# Patient Record
Sex: Male | Born: 1945 | Race: Black or African American | Hispanic: No | Marital: Married | Smoking: Former smoker
Health system: Southern US, Community
[De-identification: ages and names within clinical notes are randomized; demographics above are authoritative.]

## PROBLEM LIST (undated history)

## (undated) DIAGNOSIS — I10 Essential (primary) hypertension: Secondary | ICD-10-CM

## (undated) DIAGNOSIS — G709 Myoneural disorder, unspecified: Secondary | ICD-10-CM

## (undated) DIAGNOSIS — N400 Enlarged prostate without lower urinary tract symptoms: Secondary | ICD-10-CM

## (undated) DIAGNOSIS — E119 Type 2 diabetes mellitus without complications: Secondary | ICD-10-CM

## (undated) DIAGNOSIS — G4730 Sleep apnea, unspecified: Secondary | ICD-10-CM

## (undated) DIAGNOSIS — R600 Localized edema: Secondary | ICD-10-CM

## (undated) DIAGNOSIS — I2510 Atherosclerotic heart disease of native coronary artery without angina pectoris: Secondary | ICD-10-CM

## (undated) DIAGNOSIS — N289 Disorder of kidney and ureter, unspecified: Secondary | ICD-10-CM

## (undated) DIAGNOSIS — K4090 Unilateral inguinal hernia, without obstruction or gangrene, not specified as recurrent: Secondary | ICD-10-CM

## (undated) DIAGNOSIS — R000 Tachycardia, unspecified: Secondary | ICD-10-CM

## (undated) DIAGNOSIS — M109 Gout, unspecified: Secondary | ICD-10-CM

## (undated) DIAGNOSIS — K219 Gastro-esophageal reflux disease without esophagitis: Secondary | ICD-10-CM

## (undated) DIAGNOSIS — E785 Hyperlipidemia, unspecified: Secondary | ICD-10-CM

## (undated) DIAGNOSIS — G20A1 Parkinson's disease without dyskinesia, without mention of fluctuations: Secondary | ICD-10-CM

## (undated) HISTORY — DX: Parkinson's disease without dyskinesia, without mention of fluctuations: G20.A1

## (undated) HISTORY — PX: TONSILLECTOMY: SUR1361

## (undated) HISTORY — PX: PROSTATE SURGERY: SHX751

---
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
## 2002-11-02 DIAGNOSIS — J309 Allergic rhinitis, unspecified: Secondary | ICD-10-CM | POA: Insufficient documentation

## 2005-02-19 ENCOUNTER — Emergency Department: Payer: Self-pay | Admitting: Emergency Medicine

## 2005-02-20 ENCOUNTER — Emergency Department: Payer: Self-pay | Admitting: Emergency Medicine

## 2005-02-22 ENCOUNTER — Emergency Department: Payer: Self-pay | Admitting: Emergency Medicine

## 2005-02-25 ENCOUNTER — Emergency Department: Payer: Self-pay | Admitting: Urology

## 2005-03-12 ENCOUNTER — Emergency Department: Payer: Self-pay | Admitting: Emergency Medicine

## 2005-03-27 ENCOUNTER — Other Ambulatory Visit: Payer: Self-pay

## 2005-04-01 ENCOUNTER — Ambulatory Visit: Payer: Self-pay | Admitting: Urology

## 2006-03-30 ENCOUNTER — Emergency Department: Payer: Self-pay | Admitting: Specialist

## 2006-04-14 ENCOUNTER — Other Ambulatory Visit: Payer: Self-pay

## 2006-04-21 ENCOUNTER — Ambulatory Visit: Payer: Self-pay | Admitting: Urology

## 2006-05-11 ENCOUNTER — Emergency Department: Payer: Self-pay | Admitting: Emergency Medicine

## 2006-05-20 ENCOUNTER — Emergency Department: Payer: Self-pay | Admitting: Urology

## 2006-06-02 ENCOUNTER — Inpatient Hospital Stay: Payer: Self-pay | Admitting: Urology

## 2007-06-13 ENCOUNTER — Other Ambulatory Visit: Payer: Self-pay

## 2007-06-13 ENCOUNTER — Emergency Department: Payer: Self-pay | Admitting: Emergency Medicine

## 2008-05-23 ENCOUNTER — Emergency Department: Payer: Self-pay | Admitting: Emergency Medicine

## 2009-03-14 ENCOUNTER — Emergency Department: Payer: Self-pay | Admitting: Emergency Medicine

## 2010-10-03 DIAGNOSIS — M1710 Unilateral primary osteoarthritis, unspecified knee: Secondary | ICD-10-CM | POA: Insufficient documentation

## 2010-11-29 ENCOUNTER — Emergency Department: Payer: Self-pay | Admitting: Unknown Physician Specialty

## 2011-03-12 ENCOUNTER — Emergency Department: Payer: Self-pay | Admitting: Emergency Medicine

## 2011-03-12 LAB — URIC ACID: Uric Acid: 4.9 mg/dL (ref 3.5–7.2)

## 2011-04-21 ENCOUNTER — Emergency Department: Payer: Self-pay | Admitting: Emergency Medicine

## 2011-08-13 ENCOUNTER — Emergency Department: Payer: Self-pay | Admitting: Emergency Medicine

## 2011-08-28 ENCOUNTER — Emergency Department: Payer: Self-pay | Admitting: Internal Medicine

## 2011-09-25 ENCOUNTER — Emergency Department: Payer: Self-pay | Admitting: Emergency Medicine

## 2011-10-18 ENCOUNTER — Emergency Department: Payer: Self-pay | Admitting: Internal Medicine

## 2011-10-18 LAB — URINALYSIS, COMPLETE
Blood: NEGATIVE
Ketone: NEGATIVE
Leukocyte Esterase: NEGATIVE
Nitrite: NEGATIVE
Ph: 6 (ref 4.5–8.0)
Protein: NEGATIVE
Specific Gravity: 1.018 (ref 1.003–1.030)
WBC UR: 1 /HPF (ref 0–5)

## 2011-10-18 LAB — CBC
MCH: 31.1 pg (ref 26.0–34.0)
MCHC: 33.4 g/dL (ref 32.0–36.0)
MCV: 93 fL (ref 80–100)
Platelet: 246 10*3/uL (ref 150–440)
RDW: 15.8 % — ABNORMAL HIGH (ref 11.5–14.5)
WBC: 7.4 10*3/uL (ref 3.8–10.6)

## 2011-10-18 LAB — COMPREHENSIVE METABOLIC PANEL
Alkaline Phosphatase: 74 U/L (ref 50–136)
Bilirubin,Total: 0.2 mg/dL (ref 0.2–1.0)
Calcium, Total: 8.7 mg/dL (ref 8.5–10.1)
Chloride: 109 mmol/L — ABNORMAL HIGH (ref 98–107)
Co2: 30 mmol/L (ref 21–32)
Glucose: 87 mg/dL (ref 65–99)
Osmolality: 288 (ref 275–301)
Potassium: 3.8 mmol/L (ref 3.5–5.1)
SGPT (ALT): 17 U/L (ref 12–78)
Sodium: 144 mmol/L (ref 136–145)
Total Protein: 7.3 g/dL (ref 6.4–8.2)

## 2011-10-25 ENCOUNTER — Emergency Department: Payer: Self-pay | Admitting: Emergency Medicine

## 2011-11-03 ENCOUNTER — Emergency Department: Payer: Self-pay | Admitting: Emergency Medicine

## 2011-12-23 ENCOUNTER — Emergency Department: Payer: Self-pay

## 2011-12-23 LAB — CBC
HCT: 34.8 % — ABNORMAL LOW (ref 40.0–52.0)
HGB: 11.4 g/dL — ABNORMAL LOW (ref 13.0–18.0)
MCHC: 32.7 g/dL (ref 32.0–36.0)
MCV: 89 fL (ref 80–100)
RDW: 16.5 % — ABNORMAL HIGH (ref 11.5–14.5)

## 2011-12-23 LAB — URINALYSIS, COMPLETE
Bacteria: NONE SEEN
Bilirubin,UR: NEGATIVE
Leukocyte Esterase: NEGATIVE
Nitrite: NEGATIVE
Ph: 6 (ref 4.5–8.0)
Specific Gravity: 1.005 (ref 1.003–1.030)
Squamous Epithelial: NONE SEEN

## 2011-12-23 LAB — BASIC METABOLIC PANEL
BUN: 15 mg/dL (ref 7–18)
Calcium, Total: 8.4 mg/dL — ABNORMAL LOW (ref 8.5–10.1)
EGFR (African American): >60
EGFR (Non-African Amer.): 60 — ABNORMAL LOW
Glucose: 122 mg/dL — ABNORMAL HIGH (ref 65–99)
Osmolality: 289 (ref 275–301)

## 2011-12-23 LAB — PRO B NATRIURETIC PEPTIDE: B-Type Natriuretic Peptide: 122 pg/mL (ref 0–125)

## 2011-12-28 ENCOUNTER — Emergency Department: Payer: Self-pay | Admitting: Emergency Medicine

## 2011-12-28 LAB — COMPREHENSIVE METABOLIC PANEL
Albumin: 3.4 g/dL (ref 3.4–5.0)
Alkaline Phosphatase: 92 U/L (ref 50–136)
Bilirubin,Total: 0.2 mg/dL (ref 0.2–1.0)
Calcium, Total: 8.5 mg/dL (ref 8.5–10.1)
Co2: 29 mmol/L (ref 21–32)
Creatinine: 1.25 mg/dL (ref 0.60–1.30)
EGFR (African American): >60
Glucose: 156 mg/dL — ABNORMAL HIGH (ref 65–99)
Osmolality: 286 (ref 275–301)
SGOT(AST): 17 U/L (ref 15–37)
SGPT (ALT): 21 U/L (ref 12–78)
Total Protein: 7.3 g/dL (ref 6.4–8.2)

## 2011-12-28 LAB — CBC
HGB: 11.5 g/dL — ABNORMAL LOW (ref 13.0–18.0)
MCHC: 32.8 g/dL (ref 32.0–36.0)
Platelet: 235 10*3/uL (ref 150–440)
RBC: 3.93 10*6/uL — ABNORMAL LOW (ref 4.40–5.90)
RDW: 16.6 % — ABNORMAL HIGH (ref 11.5–14.5)
WBC: 8.2 10*3/uL (ref 3.8–10.6)

## 2011-12-28 LAB — URINALYSIS, COMPLETE
Bacteria: NONE SEEN
Bilirubin,UR: NEGATIVE
Blood: NEGATIVE
Glucose,UR: NEGATIVE mg/dL (ref 0–75)
Leukocyte Esterase: NEGATIVE
Nitrite: NEGATIVE
Protein: NEGATIVE
RBC,UR: <1 /HPF (ref 0–5)
Specific Gravity: 1.011 (ref 1.003–1.030)
WBC UR: <1 /HPF (ref 0–5)

## 2012-04-10 ENCOUNTER — Emergency Department: Payer: Self-pay | Admitting: Emergency Medicine

## 2012-05-30 DIAGNOSIS — Z9989 Dependence on other enabling machines and devices: Secondary | ICD-10-CM | POA: Insufficient documentation

## 2012-06-24 DIAGNOSIS — N189 Chronic kidney disease, unspecified: Secondary | ICD-10-CM | POA: Insufficient documentation

## 2012-08-31 ENCOUNTER — Emergency Department: Payer: Self-pay | Admitting: Psychiatry

## 2013-02-06 DIAGNOSIS — N529 Male erectile dysfunction, unspecified: Secondary | ICD-10-CM | POA: Insufficient documentation

## 2013-02-06 DIAGNOSIS — N401 Enlarged prostate with lower urinary tract symptoms: Secondary | ICD-10-CM | POA: Insufficient documentation

## 2013-02-21 ENCOUNTER — Emergency Department: Payer: Self-pay | Admitting: Emergency Medicine

## 2013-02-21 LAB — BASIC METABOLIC PANEL
BUN: 17 mg/dL (ref 7–18)
Calcium, Total: 8.4 mg/dL — ABNORMAL LOW (ref 8.5–10.1)
Chloride: 103 mmol/L (ref 98–107)
Co2: 29 mmol/L (ref 21–32)
Creatinine: 1.44 mg/dL — ABNORMAL HIGH (ref 0.60–1.30)
EGFR (African American): 57 — ABNORMAL LOW
Osmolality: 280 (ref 275–301)

## 2013-02-21 LAB — CBC WITH DIFFERENTIAL/PLATELET
Basophil #: 0.1 10*3/uL (ref 0.0–0.1)
Eosinophil %: 3 %
Lymphocyte #: 2.8 10*3/uL (ref 1.0–3.6)
Lymphocyte %: 29.8 %
MCH: 31.5 pg (ref 26.0–34.0)
MCHC: 34.2 g/dL (ref 32.0–36.0)
MCV: 92 fL (ref 80–100)
Monocyte %: 8.2 %
Neutrophil %: 57.9 %
Platelet: 252 10*3/uL (ref 150–440)
RBC: 4.2 10*6/uL — ABNORMAL LOW (ref 4.40–5.90)
RDW: 15 % — ABNORMAL HIGH (ref 11.5–14.5)

## 2013-02-21 LAB — URINALYSIS, COMPLETE
Blood: NEGATIVE
Nitrite: NEGATIVE
Ph: 6 (ref 4.5–8.0)
Protein: NEGATIVE
RBC,UR: 1 /HPF (ref 0–5)
Specific Gravity: 1.008 (ref 1.003–1.030)
WBC UR: <1 /HPF (ref 0–5)

## 2013-02-21 LAB — TROPONIN I: Troponin-I: <0.02 ng/mL

## 2013-12-01 DIAGNOSIS — Z9189 Other specified personal risk factors, not elsewhere classified: Secondary | ICD-10-CM | POA: Insufficient documentation

## 2013-12-01 DIAGNOSIS — Z889 Allergy status to unspecified drugs, medicaments and biological substances status: Secondary | ICD-10-CM | POA: Insufficient documentation

## 2014-03-05 DIAGNOSIS — K635 Polyp of colon: Secondary | ICD-10-CM | POA: Insufficient documentation

## 2014-04-29 ENCOUNTER — Emergency Department: Payer: Self-pay | Admitting: Emergency Medicine

## 2014-07-06 ENCOUNTER — Emergency Department: Payer: Medicare HMO

## 2014-07-06 ENCOUNTER — Emergency Department
Admission: EM | Admit: 2014-07-06 | Discharge: 2014-07-06 | Disposition: A | Discharge status: Home / Self Care | Payer: Medicare HMO | Attending: Emergency Medicine | Admitting: Emergency Medicine

## 2014-07-06 DIAGNOSIS — T148XXA Other injury of unspecified body region, initial encounter: Secondary | ICD-10-CM

## 2014-07-06 DIAGNOSIS — I10 Essential (primary) hypertension: Secondary | ICD-10-CM | POA: Diagnosis not present

## 2014-07-06 DIAGNOSIS — S99911A Unspecified injury of right ankle, initial encounter: Secondary | ICD-10-CM | POA: Diagnosis present

## 2014-07-06 DIAGNOSIS — Y9389 Activity, other specified: Secondary | ICD-10-CM | POA: Diagnosis not present

## 2014-07-06 DIAGNOSIS — E119 Type 2 diabetes mellitus without complications: Secondary | ICD-10-CM | POA: Diagnosis not present

## 2014-07-06 DIAGNOSIS — Y998 Other external cause status: Secondary | ICD-10-CM | POA: Insufficient documentation

## 2014-07-06 DIAGNOSIS — W1839XA Other fall on same level, initial encounter: Secondary | ICD-10-CM | POA: Insufficient documentation

## 2014-07-06 DIAGNOSIS — S9001XA Contusion of right ankle, initial encounter: Secondary | ICD-10-CM | POA: Insufficient documentation

## 2014-07-06 DIAGNOSIS — S80811A Abrasion, right lower leg, initial encounter: Secondary | ICD-10-CM | POA: Insufficient documentation

## 2014-07-06 DIAGNOSIS — Y9289 Other specified places as the place of occurrence of the external cause: Secondary | ICD-10-CM | POA: Diagnosis not present

## 2014-07-06 HISTORY — DX: Benign prostatic hyperplasia without lower urinary tract symptoms: N40.0

## 2014-07-06 HISTORY — DX: Type 2 diabetes mellitus without complications: E11.9

## 2014-07-06 HISTORY — DX: Essential (primary) hypertension: I10

## 2014-07-06 NOTE — ED Notes (Addendum)
Fell few days ago on cement has abrasion to right lower leg

## 2014-07-06 NOTE — Discharge Instructions (Signed)
Blunt Trauma You have been evaluated for injuries. You have been examined and your caregiver has not found injuries serious enough to require hospitalization. It is common to have multiple bruises and sore muscles following an accident. These tend to feel worse for the first 24 hours. You will feel more stiffness and soreness over the next several hours and worse when you wake up the first morning after your accident. After this point, you should begin to improve with each passing day. The amount of improvement depends on the amount of damage done in the accident. Following your accident, if some part of your body does not work as it should, or if the pain in any area continues to increase, you should return to the Emergency Department for re-evaluation.  HOME CARE INSTRUCTIONS  Routine care for sore areas should include:  Ice to sore areas every 2 hours for 20 minutes while awake for the next 2 days.  Drink extra fluids (not alcohol).  Take a hot or warm shower or bath once or twice a day to increase blood flow to sore muscles. This will help you "limber up".  Activity as tolerated. Lifting may aggravate neck or back pain.  Only take over-the-counter or prescription medicines for pain, discomfort, or fever as directed by your caregiver. Do not use aspirin. This may increase bruising or increase bleeding if there are small areas where this is happening. SEEK IMMEDIATE MEDICAL CARE IF:  Numbness, tingling, weakness, or problem with the use of your arms or legs.  A severe headache is not relieved with medications.  There is a change in bowel or bladder control.  Increasing pain in any areas of the body.  Short of breath or dizzy.  Nauseated, vomiting, or sweating.  Increasing belly (abdominal) discomfort.  Blood in urine, stool, or vomiting blood.  Pain in either shoulder in an area where a shoulder strap would be.  Feelings of lightheadedness or if you have a fainting  episode. Sometimes it is not possible to identify all injuries immediately after the trauma. It is important that you continue to monitor your condition after the emergency department visit. If you feel you are not improving, or improving more slowly than should be expected, call your physician. If you feel your symptoms (problems) are worsening, return to the Emergency Department immediately. Document Released: 11/05/2000 Document Revised: 05/04/2011 Document Reviewed: 09/28/2007 [facility] Patient Information 2015 [employer], [place]. This information is not intended to replace advice given to you by your health care provider. Make sure you discuss any questions you have with your health care provider.  Abrasions An abrasion is a cut or scrape of the skin. Abrasions do not go through all layers of the skin. HOME CARE  If a bandage (dressing) was put on your wound, change it as told by your doctor. If the bandage sticks, soak it off with warm.  Wash the area with water and soap 2 times a day. Rinse off the soap. Pat the area dry with a clean towel.  Put on medicated cream (ointment) as told by your doctor.  Change your bandage right away if it gets wet or dirty.  Only take medicine as told by your doctor.  See your doctor within 24-48 hours to get your wound checked.  Check your wound for redness, puffiness (swelling), or yellowish-white fluid (pus). GET HELP RIGHT AWAY IF:   You have more pain in the wound.  You have redness, swelling, or tenderness around the wound.  You have  pus coming from the wound.  You have a fever or lasting symptoms for more than 2-3 days.  You have a fever and your symptoms suddenly get worse.  You have a bad smell coming from the wound or bandage. MAKE SURE YOU:   Understand these instructions.  Will watch your condition.  Will get help right away if you are not doing well or get worse. Document Released: 07/29/2007 Document Revised: 11/04/2011  Document Reviewed: 01/13/2011 [facility] Patient Information 2015 [place], [place]. This information is not intended to replace advice given to you by your health care provider. Make sure you discuss any questions you have with your health care provider.

## 2014-07-06 NOTE — ED Notes (Signed)
Pt scraped right lower leg on driveway X 3 days ago, painful still. Scabbed over. Pt alert and oriented X4, active, cooperative, pt in NAD. RR even and unlabored, color WNL.

## 2014-07-06 NOTE — ED Provider Notes (Signed)
CSN: [id number]     Arrival date & time 07/06/14  1706 History   First MD Initiated Contact with Patient 07/06/14 1742     Chief Complaint  Patient presents with  . Abrasion     (Consider location/radiation/quality/duration/timing/severity/associated sxs/prior Treatment) HPI patient's complaining of swelling to his left ankle says is mild pain if he pushes on the area mild swelling was advised by his wife to come in for evaluation due to some bruising to the area states that he is having no difficulty walking currently denies any pain nothing seems to making it particularly better or worse denies any other symptoms currently is here for evaluation for possible injury to his ankle  Past Medical History  Diagnosis Date  . Diabetes mellitus without complication   . Enlarged prostate   . Hypertension    Past Surgical History  Procedure Laterality Date  . Prostate surgery    . Tonsillectomy     History reviewed. No pertinent family history. History  Substance Use Topics  . Smoking status: Never Smoker   . Smokeless tobacco: Not on file  . Alcohol Use: No    Review of Systems  Negative 6 systems as best review the patient's upper noted in the history of present illness   Allergies  Bean pod extract; Corn-containing products; Fish allergy; Grapeseed extract; and Sesame seed  Home Medications   Prior to Admission medications   Not on File   BP 154/69 mmHg  Pulse 56  Temp(Src) 98.2 F (36.8 C) (Oral)  Resp 18  Ht 5\' 7"  (1.702 m)  Wt 201 lb (91.173 kg)  BMI 31.47 kg/m2  SpO2 100% Physical Exam  African-American male appearing stated age well-developed well-nourished no distress file is within normal limits Head ears eyes nose neck and throat exam was unremarkable Cardiovascular regular rate and rhythm no murmurs rubs gallops Pulmonary lungs clear to auscultation bilaterally Skin has an abrasion to the medial aspect of his lower leg otherwise is free of rash or  disease Neuro exam is nonfocal cranial nerves II through XII intact good sensation around distal extremities Musculoskeletal patient has mild swelling over the medial aspect of his right ankle without palpable deformity step-off abnormality no functional deficit strength is equal and symmetrical bilaterally  ED Course  Procedures (including critical care time) Labs Review Labs Reviewed - No data to display  Imaging Review Dg Ankle Complete Right  07/06/2014   CLINICAL DATA:  Status post fall onto asphalt, with medial right ankle pain. Pain on ambulation. Initial encounter.  EXAM: RIGHT ANKLE - COMPLETE 3+ VIEW  COMPARISON:  None.  FINDINGS: There is no evidence of acute fracture or dislocation. A large rounded lucency at the medial talar dome may may reflect a large subchondral cyst or an osteochondral defect, demonstrating increased sclerosis in comparison to the prior study. A small osseous fragment distal to the medial malleolus is stable in appearance and may reflect remote avulsion injury. The ankle mortise is intact; the interosseous space is within normal limits. No talar tilt or subluxation is seen.  The joint spaces are preserved. Mild diffuse medial soft tissue swelling is noted. There is suggestion of chronic soft tissue calcification distal to the distal fibula, which may reflect underlying chronic soft tissue injury.  IMPRESSION: 1. No evidence of acute fracture or dislocation. 2. Large rounded lucency at the may reflect a large subchondral cyst or osteochondral defect, demonstrating increased sclerosis in comparison to the prior study. 3. Small osseous fragment distal to  the medial malleolus is stable in appearance and may reflect remote avulsion injury. 4. Apparent mild soft tissue calcification distal to the distal fibula may reflect underlying chronic soft tissue injury.   Electronically Signed   By: Roanna RaiderJeffery  Chang M.D.   On: 07/06/2014 18:25        ED course patient has mild  bruising to the medial aspect of his ankle as well as healing abrasions no acute injury seen on the x-ray patient does have a long-standing history of arthritis and is any quality is aware of given the previous avulsion fracture in the reinjury believed that may explain the bruising and being that the patient currently denying any pain we'll just have him follow-up with his primary care provider as needed turn for any acute concerns or worsening symptoms  MDM   Final diagnoses:  Abrasion  Contusion of ankle, right, initial encounter        Hadassah Rana Rosalyn GessWilliam C Bradlee Heitman, PA-C 07/06/14 1933  Governor Rooksebecca Lord, MD 07/06/14 1954

## 2015-07-28 ENCOUNTER — Encounter: Payer: Self-pay | Admitting: Emergency Medicine

## 2015-07-28 ENCOUNTER — Emergency Department
Admission: EM | Admit: 2015-07-28 | Discharge: 2015-07-28 | Disposition: A | Discharge status: Home / Self Care | Payer: Medicare HMO | Attending: Emergency Medicine | Admitting: Emergency Medicine

## 2015-07-28 ENCOUNTER — Emergency Department: Payer: Medicare HMO

## 2015-07-28 DIAGNOSIS — M109 Gout, unspecified: Secondary | ICD-10-CM

## 2015-07-28 DIAGNOSIS — E119 Type 2 diabetes mellitus without complications: Secondary | ICD-10-CM | POA: Insufficient documentation

## 2015-07-28 DIAGNOSIS — M10062 Idiopathic gout, left knee: Secondary | ICD-10-CM | POA: Diagnosis not present

## 2015-07-28 DIAGNOSIS — I10 Essential (primary) hypertension: Secondary | ICD-10-CM | POA: Diagnosis not present

## 2015-07-28 DIAGNOSIS — M25562 Pain in left knee: Secondary | ICD-10-CM | POA: Diagnosis present

## 2015-07-28 LAB — CBC WITH DIFFERENTIAL/PLATELET
Basophils Absolute: 0.1 10*3/uL (ref 0–0.1)
Basophils Relative: 1 %
EOS ABS: 0.1 10*3/uL (ref 0–0.7)
HCT: 39.3 % — ABNORMAL LOW (ref 40.0–52.0)
Hemoglobin: 13.2 g/dL (ref 13.0–18.0)
LYMPHS ABS: 1.9 10*3/uL (ref 1.0–3.6)
Lymphocytes Relative: 18 %
MCH: 32.4 pg (ref 26.0–34.0)
MCHC: 33.5 g/dL (ref 32.0–36.0)
MCV: 96.5 fL (ref 80.0–100.0)
MONO ABS: 1 10*3/uL (ref 0.2–1.0)
Neutro Abs: 7.2 10*3/uL — ABNORMAL HIGH (ref 1.4–6.5)
Neutrophils Relative %: 69 %
PLATELETS: 208 10*3/uL (ref 150–440)
RBC: 4.07 MIL/uL — ABNORMAL LOW (ref 4.40–5.90)
RDW: 14.3 % (ref 11.5–14.5)
WBC: 10.3 10*3/uL (ref 3.8–10.6)

## 2015-07-28 LAB — URIC ACID: URIC ACID, SERUM: 8.6 mg/dL — AB (ref 4.4–7.6)

## 2015-07-28 MED ORDER — COLCHICINE 0.6 MG PO TABS: 0.6000 mg | ORAL_TABLET | Freq: Every day | ORAL | Status: DC

## 2015-07-28 MED ORDER — ETODOLAC 300 MG PO CAPS: 300.0000 mg | ORAL_CAPSULE | Freq: Three times a day (TID) | ORAL | Status: AC | PRN

## 2015-07-28 NOTE — ED Provider Notes (Signed)
[facility] Emergency Department Provider Note  ____________________________________________  Time seen: Approximately 3:44 PM  I have reviewed the triage vital signs and the nursing notes.   HISTORY  Chief Complaint Knee Pain    HPI Paul DIEUDONNE Sr. is a 70 y.o. male , NAD, presents to the emergency department with 2 day history of left knee pain. States the pain began yesterday and has progressively gotten worse. Has a history of gout and thought that it was such. Has not had any lab work for gout in some time. He is taking colchicine 1 tablet in the morning and in the evening since yesterday morning. Has noted no significant improvement of pain. Denies any injury or trauma nor any falls. Denies any numbness, weakness, tingling. Has not noticed any redness or swelling below the knee.Has not noted any open wounds, skin sores, oozing or weeping. Denies chest pain, shortness breath, visual changes or headaches.   Past Medical History  Diagnosis Date  . Diabetes mellitus without complication (HCC)   . Enlarged prostate   . Hypertension     There are no active problems to display for this patient.   Past Surgical History  Procedure Laterality Date  . Prostate surgery    . Tonsillectomy      Current Outpatient Rx  Name  Route  Sig  Dispense  Refill  . colchicine 0.6 MG tablet   Oral   Take 1 tablet (0.6 mg total) by mouth daily. Take 2 tablets at onset of pain, then may take 1 more 1 hour later if pain continues.   6 tablet   0   . etodolac (LODINE) 300 MG capsule   Oral   Take 1 capsule (300 mg total) by mouth 3 (three) times daily with meals as needed.   21 capsule   0     Allergies Bean pod extract; Corn-containing products; Fish allergy; Grapeseed extract; and Sesame seed  No family history on file.  Social History Social History  Substance Use Topics  . Smoking status: Never Smoker   . Smokeless tobacco: None  . Alcohol Use:  No     Review of Systems  Constitutional: No fever/chills, fatigue Eyes: No visual changes.  Cardiovascular: No chest pain. Respiratory: No shortness of breath. No wheezing.  Gastrointestinal: No abdominal pain.  No nausea, vomiting.  Musculoskeletal: Positive left knee pain. Negative for back, hip, ankle pain.  Skin: Positive redness and swelling about left knee. Negative for rash or skin sores, oozing, weeping. Neurological: Negative for headaches, focal weakness or numbness. No tingling 10-point ROS otherwise negative.  ____________________________________________   PHYSICAL EXAM:  VITAL SIGNS: ED Triage Vitals  Enc Vitals Group     BP 07/28/15 1445 158/78 mmHg     Pulse Rate 07/28/15 1445 65     Resp 07/28/15 1445 20     Temp 07/28/15 1445 98.7 F (37.1 C)     Temp Source 07/28/15 1445 Oral     SpO2 07/28/15 1445 97 %     Weight 07/28/15 1445 195 lb (88.451 kg)     Height 07/28/15 1445  (1.702 m)     Head Cir --      Peak Flow --      Pain Score 07/28/15 1444 6     Pain Loc --      Pain Edu? --      Excl. in GC? --      Constitutional: Alert and oriented. Well appearing and  in no acute distress. Eyes: Conjunctivae are normal.  Head: Atraumatic. Cardiovascular: Good peripheral circulation with 2+ pulses noted in bilateral lower extremities. Respiratory: Normal respiratory effort without tachypnea or retractions.  Musculoskeletal: Tenderness to palpation about the lateral knee. No patellofemoral grinds pain. No mass noted to palpation of the back of the knee. Full range of motion of the knee is noted but with pain with full flexion. No lower extremity tenderness nor edema.  No joint effusions. Patient ambulates with a cane. Neurologic:  Normal speech and language. No gross focal neurologic deficits are appreciated. Sensation to light touch grossly normal about the left lower extremity. Skin:  Skin about anterior knee is mildly erythematous with some warmth to  palpation. No evidence of skin sores, oozing, weeping or open wounds. Skin is warm, dry and intact. No rash noted. Psychiatric: Mood and affect are normal. Speech and behavior are normal. Patient exhibits appropriate insight and judgement.   ____________________________________________   LABS (all labs ordered are listed, but only abnormal results are displayed)  Labs Reviewed  URIC ACID - Abnormal; Notable for the following:    Uric Acid, Serum 8.6 (*)    All other components within normal limits  CBC WITH DIFFERENTIAL/PLATELET - Abnormal; Notable for the following:    RBC 4.07 (*)    HCT 39.3 (*)    Neutro Abs 7.2 (*)    All other components within normal limits   ____________________________________________  EKG  None ____________________________________________  RADIOLOGY I have personally viewed and evaluated these images (plain radiographs) as part of my medical decision making, as well as reviewing the written report by the radiologist.  Dg Knee Complete 4 Views Left  07/28/2015  CLINICAL DATA:  Generalized left knee pain and swelling for 1 day. No known injury. History of diabetes. EXAM: LEFT KNEE - COMPLETE 4+ VIEW COMPARISON:  None. FINDINGS: The bones appear demineralized. There is no evidence of acute fracture or dislocation. There are mild tricompartmental degenerative changes with medial compartment joint space loss and osteophytes. 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 There is irregularity of the upper pole of the patella laterally, likely a developmental variant. There is evidence of a small to moderate joint effusion. Femoral popliteal vascular calcifications are noted. IMPRESSION: No acute osseous findings. Degenerative changes, chondrocalcinosis and joint effusion noted. Electronically Signed   By: Carey Bullocks M.D.   On: 07/28/2015 16:22    ____________________________________________    PROCEDURES  Procedure(s) performed: None   Medications -  No data to display   ____________________________________________   INITIAL IMPRESSION / ASSESSMENT AND PLAN / ED COURSE  Pertinent labs & imaging results that were available during my care of the patient were reviewed by me and considered in my medical decision making (see chart for details).  Patient's diagnosis is consistent with acute gout of the left knee. Patient will be discharged home with prescriptions for colchicine and Lodine to take as directed. Patient is to follow up with his primary care provider or [facility] if symptoms persist past this treatment course. Patient is given ED precautions to return to the ED for any worsening or new symptoms.    ____________________________________________  FINAL CLINICAL IMPRESSION(S) / ED DIAGNOSES  Final diagnoses:  Acute gout of left knee, unspecified cause      NEW MEDICATIONS STARTED DURING THIS VISIT:  New Prescriptions   COLCHICINE 0.6 MG TABLET    Take 1 tablet (0.6 mg total) by mouth daily. Take 2 tablets at onset of pain, then  may take 1 more 1 hour later if pain continues.   ETODOLAC (LODINE) 300 MG CAPSULE    Take 1 capsule (300 mg total) by mouth 3 (three) times daily with meals as needed.         Hope PigeonJami L Elbony Mcclimans, PA-C 07/28/15 1638  Loleta Roseory Forbach, MD 07/28/15 2055

## 2015-07-28 NOTE — ED Notes (Signed)
States he woke up with pain to left knee this am   States pain is lateral  With min swelling ..denies any injury

## 2015-07-28 NOTE — Discharge Instructions (Signed)

## 2015-07-28 NOTE — ED Notes (Signed)
Pt reports left knee pain and swelling that started yesterday and gotten worse today. Denies injury

## 2017-03-04 ENCOUNTER — Ambulatory Visit (INDEPENDENT_AMBULATORY_CARE_PROVIDER_SITE_OTHER): Payer: Medicare PPO | Admitting: Urology

## 2017-03-04 ENCOUNTER — Encounter: Payer: Self-pay | Admitting: Urology

## 2017-03-04 VITALS — BP 135/61 | HR 63 | Ht 67.0 in | Wt 191.0 lb

## 2017-03-04 DIAGNOSIS — N529 Male erectile dysfunction, unspecified: Secondary | ICD-10-CM | POA: Diagnosis not present

## 2017-03-04 DIAGNOSIS — N401 Enlarged prostate with lower urinary tract symptoms: Secondary | ICD-10-CM

## 2017-03-04 NOTE — Progress Notes (Signed)
03/04/2017 3:30 PM   Kathrene BongoHerbert L Littlepage Sr. 12/20/1945 [id number]  Referring provider: No referring provider defined for this encounter.  Chief Complaint  Patient presents with  . Erectile Dysfunction    1year    HPI: 72 year old male who states he is presenting for annual follow-up however [facility]  He has been followed for BPH and erectile dysfunction.  He is status post suprapubic prostatectomy in 2008 for significant BPH with urinary retention.  He had a significant PSA elevation however his PSA normalized after the prostatectomy.  Last PSA in 2014 was 0.7 and he elected to discontinue PSA screening at that time.  He denies bothersome lower urinary tract symptoms.  He denies dysuria or gross hematuria.  He has no flank, abdominal, pelvic or scrotal pain.  He has partial erections which are not firm enough for penetration.  He was given a trial of generic sildenafil at our last visit in March 2018 however states it has not been effective.   PMH: Past Medical History:  Diagnosis Date  . Diabetes mellitus without complication (HCC)   . Enlarged prostate   . Hypertension     Surgical History: Past Surgical History:  Procedure Laterality Date  . PROSTATE SURGERY    . TONSILLECTOMY      Home Medications:  Allergies as of 03/04/2017      Reactions   Orange Oil Swelling   Bean Pod Extract    Corn-containing Products    Fish Allergy    Grapeseed Extract [nutritional Supplements]    Sesame Seed [sesame Oil]    Proanthocyanidin Itching      Medication List        Accurate as of 03/04/17  3:30 PM. Always use your most recent med list.          amLODipine 10 MG tablet Commonly known as:  NORVASC   aspirin 81 MG chewable tablet Chew 81 mg by mouth.   atorvastatin 40 MG tablet Commonly known as:  LIPITOR   chlorthalidone 25 MG tablet Commonly known as:  HYGROTON   colchicine 0.6 MG tablet Take 1 tablet (0.6 mg total) by mouth daily.  Take 2 tablets at onset of pain, then may take 1 more 1 hour later if pain continues.   hydrALAZINE 50 MG tablet Commonly known as:  APRESOLINE   lisinopril 20 MG tablet Commonly known as:  PRINIVIL,ZESTRIL   ranitidine 150 MG tablet Commonly known as:  ZANTAC       Allergies:  Allergies  Allergen Reactions  . Orange Oil Swelling  . Bean Pod Extract   . Corn-Containing Products   . Fish Allergy   . Grapeseed Extract [Nutritional Supplements]   . Sesame Seed [Sesame Oil]   . Proanthocyanidin Itching    Family History: Family History  Problem Relation Age of Onset  . Prostate cancer Brother   . Kidney cancer Neg Hx   . Bladder Cancer Neg Hx     Social History:  reports that he has quit smoking. he has never used smokeless tobacco. He reports that he does not drink alcohol or use drugs.  ROS: UROLOGY Frequent Urination?: No Hard to postpone urination?: No Burning/pain with urination?: No Get up at night to urinate?: No Leakage of urine?: No Urine stream starts and stops?: No Trouble starting stream?: No Do you have to strain to urinate?: No Blood in urine?: No Urinary tract infection?: No Sexually transmitted disease?: No Injury to kidneys  or bladder?: No Painful intercourse?: No Weak stream?: No Erection problems?: No Penile pain?: No  Gastrointestinal Nausea?: No Vomiting?: No Indigestion/heartburn?: Yes Diarrhea?: No Constipation?: No  Constitutional Fever: No Night sweats?: No Weight loss?: No Fatigue?: No  Skin Skin rash/lesions?: No Itching?: Yes  Eyes Blurred vision?: Yes Double vision?: No  Ears/Nose/Throat Sore throat?: No Sinus problems?: Yes  Hematologic/Lymphatic Swollen glands?: No Easy bruising?: No  Cardiovascular Leg swelling?: Yes Chest pain?: No  Respiratory Cough?: Yes Shortness of breath?: Yes  Endocrine Excessive thirst?: No  Musculoskeletal Back pain?: Yes Joint pain?: Yes  Neurological Headaches?:  No Dizziness?: No  Psychologic Depression?: No Anxiety?: No  Physical Exam: BP 135/61   Pulse 63   Ht 5\' 7"  (1.702 m)   Wt 191 lb (86.6 kg)   BMI 29.91 kg/m   Constitutional:  Alert and oriented, No acute distress. HEENT: [place] AT, moist mucus membranes.  Trachea midline, no masses. Cardiovascular: No clubbing, cyanosis, or edema. Respiratory: Normal respiratory effort, no increased work of breathing. GI: Abdomen is soft, nontender, nondistended, no abdominal masses GU: No CVA tenderness.  Skin: No rashes, bruises or suspicious lesions. Lymph: No cervical or inguinal adenopathy. Neurologic: Grossly intact, no focal deficits, moving all 4 extremities. Psychiatric: Normal mood and affect.  Laboratory Data: Lab Results  Component Value Date   WBC 10.3 07/28/2015   HGB 13.2 07/28/2015   HCT 39.3 (L) 07/28/2015   MCV 96.5 07/28/2015   PLT 208 07/28/2015    Lab Results  Component Value Date   CREATININE 1.44 (H) 02/21/2013     Assessment & Plan:    Stable lower urinary tract symptoms which are not bothersome.  I discussed other options for ED including vacuum erection devices and intracavernosal injections.  He does not desire to pursue additional treatment at this time.  Return in about 1 year (around 03/04/2018) for Recheck.  Riki Altes, MD  [facility] [address], [address] [place], [place] [address] [phone]

## 2017-03-08 ENCOUNTER — Encounter: Payer: Self-pay | Admitting: Urology

## 2018-03-02 ENCOUNTER — Encounter: Payer: Self-pay | Admitting: Urology

## 2018-03-02 ENCOUNTER — Ambulatory Visit (INDEPENDENT_AMBULATORY_CARE_PROVIDER_SITE_OTHER): Payer: Medicare PPO | Admitting: Urology

## 2018-03-02 VITALS — BP 149/76 | HR 64 | Ht 67.0 in | Wt 174.1 lb

## 2018-03-02 DIAGNOSIS — N401 Enlarged prostate with lower urinary tract symptoms: Secondary | ICD-10-CM | POA: Diagnosis not present

## 2018-03-02 LAB — BLADDER SCAN AMB NON-IMAGING

## 2018-03-02 NOTE — Progress Notes (Signed)
03/02/2018 11:41 AM   Paul Bongo Sr. November 05, 1945 [id number]  Referring provider: No referring provider defined for this encounter.  Chief Complaint  Patient presents with   Benign Prostatic Hypertrophy    HPI: Paul PUDLO Sr. is a 73 yo M presents today for an annual f/u for the evaluation and management of BPH with LUTS.  -Followed up for BPH and erectile dysfunction -s/p suprapubic prostatectomy in 2008 for significant BPH with urinary retention.  -He denies bothersome lower urinary tract symptoms.  He denies dysuria or gross hematuria.  He has no flank, abdominal, pelvic or scrotal pain.  -He has partial erections; not firm enough for penetration -Generic sildenafil given in March 2018 and was not effective  -PVR is 2 mL   PMH: Past Medical History:  Diagnosis Date   Diabetes mellitus without complication (HCC)    Enlarged prostate    Hypertension     Surgical History: Past Surgical History:  Procedure Laterality Date   PROSTATE SURGERY     TONSILLECTOMY      Home Medications:  Allergies as of 03/02/2018      Reactions   Orange Oil Swelling   Bean Pod Extract    Corn-containing Products    Fish Allergy    Grapeseed Extract [nutritional Supplements]    Sesame Seed [sesame Oil]    Proanthocyanidin Itching      Medication List       Accurate as of March 02, 2018 11:41 AM. Always use your most recent med list.        acetaminophen 500 MG tablet Commonly known as:  TYLENOL Take by mouth.   albuterol 108 (90 Base) MCG/ACT inhaler Commonly known as:  PROVENTIL HFA;VENTOLIN HFA Inhale into the lungs.   amLODipine 10 MG tablet Commonly known as:  NORVASC   aspirin 81 MG chewable tablet Chew 81 mg by mouth.   atorvastatin 40 MG tablet Commonly known as:  LIPITOR   chlorthalidone 25 MG tablet Commonly known as:  HYGROTON   colchicine 0.6 MG tablet Take 1 tablet (0.6 mg total) by mouth daily. Take 2 tablets at onset of pain, then  may take 1 more 1 hour later if pain continues.   hydrALAZINE 50 MG tablet Commonly known as:  APRESOLINE   lisinopril 20 MG tablet Commonly known as:  PRINIVIL,ZESTRIL   ranitidine 150 MG tablet Commonly known as:  ZANTAC       Allergies:  Allergies  Allergen Reactions   Orange Oil Swelling   Bean Pod Extract    Corn-Containing Products    Fish Allergy    Grapeseed Extract [Nutritional Supplements]    Sesame Seed [Sesame Oil]    Proanthocyanidin Itching    Family History: Family History  Problem Relation Age of Onset   Prostate cancer Brother    Kidney cancer Neg Hx    Bladder Cancer Neg Hx     Social History:  reports that he has quit smoking. He has never used smokeless tobacco. He reports that he does not drink alcohol or use drugs.  ROS: UROLOGY Frequent Urination?: No Hard to postpone urination?: No Burning/pain with urination?: No Get up at night to urinate?: No Leakage of urine?: No Urine stream starts and stops?: No Trouble starting stream?: No Do you have to strain to urinate?: No Blood in urine?: No Urinary tract infection?: No Sexually transmitted disease?: No Injury to kidneys or bladder?: No Painful intercourse?: No Weak stream?: No Erection problems?: No Penile pain?: No  Gastrointestinal Nausea?: No Vomiting?: No Indigestion/heartburn?: No Diarrhea?: No Constipation?: No  Constitutional Fever: No Night sweats?: No Weight loss?: No Fatigue?: No  Skin Skin rash/lesions?: No Itching?: No  Eyes Blurred vision?: No Double vision?: No  Ears/Nose/Throat Sore throat?: No Sinus problems?: No  Hematologic/Lymphatic Swollen glands?: No Easy bruising?: No  Cardiovascular Leg swelling?: Yes Chest pain?: No  Respiratory Cough?: Yes Shortness of breath?: No  Endocrine Excessive thirst?: No  Musculoskeletal Back pain?: Yes Joint pain?: Yes  Neurological Headaches?: No Dizziness?:  No  Psychologic Depression?: No Anxiety?: No  Physical Exam: BP (!) 149/76 (BP Location: Left Arm, Patient Position: Sitting, Cuff Size: Normal)    Pulse 64    Ht 5\' 7"  (1.702 m)    Wt 174 lb 1.6 oz (79 kg)    BMI 27.27 kg/m   Constitutional:  Alert and oriented, No acute distress. HEENT: [place] AT, moist mucus membranes.  Trachea midline, no masses. Cardiovascular: No clubbing, cyanosis, or edema. Respiratory: Normal respiratory effort, no increased work of breathing. Skin: No rashes, bruises or suspicious lesions. Neurologic: Grossly intact, no focal deficits, moving all 4 extremities. Psychiatric: Normal mood and affect.  Pertinent Imaging: Results for orders placed or performed in visit on 03/02/18  Bladder Scan (Post Void Residual) in office  Result Value Ref Range   Scan Result 38ml    Assessment & Plan:   1. BPH with LUTS -Stable LUTS; not bothersome -PVR is 2 mL  -F/u as needed   2. Erectile Dysfunction -Sildenafil was not effective  -ED including vacuum erection devices and intracavernosal injections discussed; pt not interested  -Pt is not bothered due to his age and lack of sexual activity -F/u as needed   Return if symptoms worsen or fail to improve.  Riki Altes, MD  [facility] [address], [address] [place], [place] [address] [phone]  I, Paul Quinn, am acting as a scribe for Dr. Lorin Picket C. Stoioff,  I, Riki Altes, MD, have reviewed all documentation for this visit. The documentation on 03/02/18 for the exam, diagnosis, procedures, and orders are all accurate and complete.

## 2018-03-03 ENCOUNTER — Ambulatory Visit: Payer: Medicare PPO | Admitting: Urology

## 2018-11-02 ENCOUNTER — Other Ambulatory Visit: Payer: Self-pay | Admitting: Ophthalmology

## 2018-11-02 DIAGNOSIS — H532 Diplopia: Secondary | ICD-10-CM

## 2018-11-04 ENCOUNTER — Other Ambulatory Visit: Payer: Self-pay | Admitting: Ophthalmology

## 2018-11-04 DIAGNOSIS — H532 Diplopia: Secondary | ICD-10-CM

## 2018-11-11 ENCOUNTER — Ambulatory Visit
Admission: RE | Admit: 2018-11-11 | Discharge: 2018-11-11 | Disposition: A | Discharge status: Home / Self Care | Payer: Medicare PPO | Source: Ambulatory Visit | Attending: Ophthalmology | Admitting: Ophthalmology

## 2018-11-11 ENCOUNTER — Other Ambulatory Visit: Payer: Self-pay

## 2018-11-11 DIAGNOSIS — H532 Diplopia: Secondary | ICD-10-CM | POA: Diagnosis present

## 2018-11-11 HISTORY — DX: Disorder of kidney and ureter, unspecified: N28.9

## 2018-11-11 LAB — POCT I-STAT CREATININE: Creatinine, Ser: 1.8 mg/dL — ABNORMAL HIGH (ref 0.61–1.24)

## 2018-11-11 MED ORDER — IOHEXOL 300 MG/ML  SOLN
75.0000 mL | Freq: Once | INTRAMUSCULAR | Status: AC | PRN
  Administered 2018-11-11: 60 mL via INTRAVENOUS

## 2020-08-09 ENCOUNTER — Emergency Department
Admission: EM | Admit: 2020-08-09 | Discharge: 2020-08-09 | Disposition: A | Discharge status: Home / Self Care | Payer: Medicare PPO | Attending: Student in an Organized Health Care Education/Training Program | Admitting: Student in an Organized Health Care Education/Training Program

## 2020-08-09 ENCOUNTER — Encounter: Payer: Self-pay | Admitting: Emergency Medicine

## 2020-08-09 ENCOUNTER — Emergency Department: Payer: Medicare PPO

## 2020-08-09 DIAGNOSIS — I129 Hypertensive chronic kidney disease with stage 1 through stage 4 chronic kidney disease, or unspecified chronic kidney disease: Secondary | ICD-10-CM | POA: Diagnosis not present

## 2020-08-09 DIAGNOSIS — W01198A Fall on same level from slipping, tripping and stumbling with subsequent striking against other object, initial encounter: Secondary | ICD-10-CM | POA: Diagnosis not present

## 2020-08-09 DIAGNOSIS — W19XXXA Unspecified fall, initial encounter: Secondary | ICD-10-CM

## 2020-08-09 DIAGNOSIS — E1169 Type 2 diabetes mellitus with other specified complication: Secondary | ICD-10-CM | POA: Insufficient documentation

## 2020-08-09 DIAGNOSIS — E1122 Type 2 diabetes mellitus with diabetic chronic kidney disease: Secondary | ICD-10-CM | POA: Insufficient documentation

## 2020-08-09 DIAGNOSIS — Z79899 Other long term (current) drug therapy: Secondary | ICD-10-CM | POA: Diagnosis not present

## 2020-08-09 DIAGNOSIS — S0083XA Contusion of other part of head, initial encounter: Secondary | ICD-10-CM | POA: Diagnosis not present

## 2020-08-09 DIAGNOSIS — Z87891 Personal history of nicotine dependence: Secondary | ICD-10-CM | POA: Insufficient documentation

## 2020-08-09 DIAGNOSIS — N529 Male erectile dysfunction, unspecified: Secondary | ICD-10-CM | POA: Diagnosis not present

## 2020-08-09 DIAGNOSIS — Z7982 Long term (current) use of aspirin: Secondary | ICD-10-CM | POA: Diagnosis not present

## 2020-08-09 DIAGNOSIS — N189 Chronic kidney disease, unspecified: Secondary | ICD-10-CM | POA: Diagnosis not present

## 2020-08-09 DIAGNOSIS — S00511A Abrasion of lip, initial encounter: Secondary | ICD-10-CM | POA: Insufficient documentation

## 2020-08-09 DIAGNOSIS — S0990XA Unspecified injury of head, initial encounter: Secondary | ICD-10-CM | POA: Diagnosis present

## 2020-08-09 NOTE — ED Triage Notes (Signed)
Pt reports he tripped over a piece of wood and fell onto the concrete around 1400. Pt hit head and face. Pt denies LOC. Bruising and redness noted to the left side of forehead and bottom lip laceration (approx1/2 inch.) Bleeding controlled.

## 2020-08-09 NOTE — ED Provider Notes (Signed)
[facility]-EMERGENCY DEPARTMENT  ____________________________________________  Time seen: Approximately 10:31 PM  I have reviewed the triage vital signs and the nursing notes.   HISTORY  Chief Complaint Fall   Historian Patient    HPI Paul Quinn. is a 75 y.o. male presents to the emergency department after patient tripped over a piece of wood and fell onto a concrete surface.  Patient states that he hit his head and might have bitten his lip.  Patient has an abrasion over his lower lip.  He also has a bruise along the left side of his forehead.  He denies neck pain.  No numbness or tingling in upper and lower extremities.  He denies chest pain, chest tightness or abdominal pain.  Patient states that he takes daily aspirin but no other blood thinners.   Past Medical History:  Diagnosis Date   Diabetes mellitus without complication (HCC)    Enlarged prostate    Hypertension    Renal insufficiency      Immunizations up to date:  Yes.     Past Medical History:  Diagnosis Date   Diabetes mellitus without complication (HCC)    Enlarged prostate    Hypertension    Renal insufficiency     Patient Active Problem List   Diagnosis Date Noted   Colon polyps 03/05/2014   H/O multiple allergies 12/01/2013   Erectile dysfunction 02/06/2013   Benign prostatic hyperplasia with lower urinary tract symptoms 02/06/2013   CKD (chronic kidney disease) 06/24/2012   OSA on CPAP 05/30/2012   Primary localized osteoarthrosis, lower leg 10/03/2010   Type II diabetes mellitus (HCC) 06/13/2008   CAD (coronary artery disease) 11/27/2007   Allergic rhinitis 11/02/2002   Gout 01/19/2001   Other specified cardiac arrhythmias 05/25/1989   Essential hypertension 02/23/1899    Past Surgical History:  Procedure Laterality Date   PROSTATE SURGERY     TONSILLECTOMY      Prior to Admission medications   Medication Sig Start Date End Date Taking? Authorizing Provider  acetaminophen  (TYLENOL) 500 MG tablet Take by mouth.    [provider]  albuterol (PROVENTIL HFA;VENTOLIN HFA) 108 (90 Base) MCG/ACT inhaler Inhale into the lungs. 07/29/17   [provider]  amLODipine (NORVASC) 10 MG tablet  02/04/17   [provider]  aspirin 81 MG chewable tablet Chew 81 mg by mouth. 03/05/14   [provider]  atorvastatin (LIPITOR) 40 MG tablet  02/08/17   [provider]  chlorthalidone (HYGROTON) 25 MG tablet  12/29/16   [provider]  colchicine 0.6 MG tablet Take 1 tablet (0.6 mg total) by mouth daily. Take 2 tablets at onset of pain, then may take 1 more 1 hour later if pain continues. 07/28/15   Hagler, Jami L, PA-C  hydrALAZINE (APRESOLINE) 50 MG tablet  02/04/17   [provider]  lisinopril (PRINIVIL,ZESTRIL) 20 MG tablet  12/29/16   [provider]  ranitidine (ZANTAC) 150 MG tablet  01/20/17   [provider]    Allergies Orange oil, Bean pod extract, Corn-containing products, Fish allergy, Grapeseed extract [nutritional supplements], Sesame seed [sesame oil], and Proanthocyanidin  Family History  Problem Relation Age of Onset   Prostate cancer Brother    Kidney cancer Neg Hx    Bladder Cancer Neg Hx     Social History Social History   Tobacco Use   Smoking status: Former    Pack years: 0.00   Smokeless tobacco: Never  Substance Use Topics  Alcohol use: No   Drug use: No     Review of Systems  Constitutional: No fever/chills Eyes:  No discharge ENT: No upper respiratory complaints. Respiratory: no cough. No SOB/ use of accessory muscles to breath Gastrointestinal:   No nausea, no vomiting.  No diarrhea.  No constipation. Musculoskeletal: Negative for musculoskeletal pain. Skin: Patient has a lip abrasion.    ____________________________________________   PHYSICAL EXAM:  VITAL SIGNS: ED Triage Vitals  Enc Vitals Group     BP 08/09/20 1849 (!) 154/74     Pulse Rate  08/09/20 1849 67     Resp 08/09/20 1849 17     Temp 08/09/20 1849 97.9 F (36.6 C)     Temp Source 08/09/20 1849 Oral     SpO2 08/09/20 1849 99 %     Weight --      Height --      Head Circumference --      Peak Flow --      Pain Score 08/09/20 2224 0     Pain Loc --      Pain Edu? --      Excl. in GC? --      Constitutional: Alert and oriented. Well appearing and in no acute distress. Eyes: Conjunctivae are normal. PERRL. EOMI. Head: Atraumatic. ENT:      Ears: TMs are pearly.      Nose: No congestion/rhinnorhea.      Mouth/Throat: Mucous membranes are moist.  Neck: No stridor.  Full range of motion.  No midline C-spine tenderness to palpation. Cardiovascular: Normal rate, regular rhythm. Normal S1 and S2.  Good peripheral circulation. Respiratory: Normal respiratory effort without tachypnea or retractions. Lungs CTAB. Good air entry to the bases with no decreased or absent breath sounds Gastrointestinal: Bowel sounds x 4 quadrants. Soft and nontender to palpation. No guarding or rigidity. No distention. Musculoskeletal: Full range of motion to all extremities. No obvious deformities noted Neurologic:  Normal for age. No gross focal neurologic deficits are appreciated.  Skin: Patient has small abrasion along lower lip. Psychiatric: Mood and affect are normal for age. Speech and behavior are normal.   ____________________________________________   LABS (all labs ordered are listed, but only abnormal results are displayed)  Labs Reviewed - No data to display ____________________________________________  EKG   ____________________________________________  RADIOLOGY Geraldo Pitter, personally viewed and evaluated these images (plain radiographs) as part of my medical decision making, as well as reviewing the written report by the radiologist.  CT Head Wo Contrast  Result Date: 08/09/2020 CLINICAL DATA:  Recent trip and fall with headaches, initial encounter EXAM: CT  HEAD WITHOUT CONTRAST TECHNIQUE: Contiguous axial images were obtained from the base of the skull through the vertex without intravenous contrast. COMPARISON:  11/11/2018 FINDINGS: Brain: No evidence of acute infarction, hemorrhage, hydrocephalus, extra-axial collection or mass lesion/mass effect. 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 Vascular: No hyperdense vessel or unexpected calcification. Skull: Normal. Negative for fracture or focal lesion. Sinuses/Orbits: No acute finding. Other: None. IMPRESSION: No acute intracranial abnormality noted. Electronically Signed   By: Alcide Clever M.D.   On: 08/09/2020 20:09    ____________________________________________    PROCEDURES  Procedure(s) performed:     Procedures     Medications - No data to display   ____________________________________________   INITIAL IMPRESSION / ASSESSMENT AND PLAN / ED COURSE  Pertinent labs & imaging results that were available during my care of the patient were reviewed by  me and considered in my medical decision making (see chart for details).      Assessment and plan Fall 75 year old male presents to the emergency department after a mechanical fall.  Patient was hypertensive and bradycardic at triage but vital signs otherwise reassuring.  He was alert, active and nontoxic-appearing.  He had no neurodeficits noted on exam.  CT head showed no evidence of intracranial bleed or skull fracture.  He declined pain medication in the emergency department stating that he had Tylenol at home.  Return precautions were given to return with new or worsening symptoms.     ____________________________________________  FINAL CLINICAL IMPRESSION(S) / ED DIAGNOSES  Final diagnoses:  Fall, initial encounter      NEW MEDICATIONS STARTED DURING THIS VISIT:  ED Discharge Orders     None           This chart was dictated using voice recognition software/Dragon. Despite  best efforts to proofread, errors can occur which can change the meaning. Any change was purely unintentional.     Gasper Lloyd 08/09/20 2234    Willy Eddy, MD 08/10/20 [phone]

## 2020-09-01 ENCOUNTER — Other Ambulatory Visit: Payer: Self-pay

## 2020-09-01 ENCOUNTER — Encounter: Payer: Self-pay | Admitting: Emergency Medicine

## 2020-09-01 ENCOUNTER — Emergency Department: Payer: Medicare PPO

## 2020-09-01 ENCOUNTER — Emergency Department
Admission: EM | Admit: 2020-09-01 | Discharge: 2020-09-01 | Disposition: A | Discharge status: Home / Self Care | Payer: Medicare PPO | Attending: Emergency Medicine | Admitting: Emergency Medicine

## 2020-09-01 DIAGNOSIS — S80212A Abrasion, left knee, initial encounter: Secondary | ICD-10-CM | POA: Insufficient documentation

## 2020-09-01 DIAGNOSIS — Z87891 Personal history of nicotine dependence: Secondary | ICD-10-CM | POA: Insufficient documentation

## 2020-09-01 DIAGNOSIS — Z23 Encounter for immunization: Secondary | ICD-10-CM | POA: Diagnosis not present

## 2020-09-01 DIAGNOSIS — T07XXXA Unspecified multiple injuries, initial encounter: Secondary | ICD-10-CM

## 2020-09-01 DIAGNOSIS — S8002XA Contusion of left knee, initial encounter: Secondary | ICD-10-CM

## 2020-09-01 DIAGNOSIS — I2510 Atherosclerotic heart disease of native coronary artery without angina pectoris: Secondary | ICD-10-CM | POA: Insufficient documentation

## 2020-09-01 DIAGNOSIS — W010XXA Fall on same level from slipping, tripping and stumbling without subsequent striking against object, initial encounter: Secondary | ICD-10-CM | POA: Diagnosis not present

## 2020-09-01 DIAGNOSIS — Z7982 Long term (current) use of aspirin: Secondary | ICD-10-CM | POA: Diagnosis not present

## 2020-09-01 DIAGNOSIS — Y92017 Garden or yard in single-family (private) house as the place of occurrence of the external cause: Secondary | ICD-10-CM | POA: Diagnosis not present

## 2020-09-01 DIAGNOSIS — S0083XA Contusion of other part of head, initial encounter: Secondary | ICD-10-CM

## 2020-09-01 DIAGNOSIS — I1310 Hypertensive heart and chronic kidney disease without heart failure, with stage 1 through stage 4 chronic kidney disease, or unspecified chronic kidney disease: Secondary | ICD-10-CM | POA: Insufficient documentation

## 2020-09-01 DIAGNOSIS — S0012XA Contusion of left eyelid and periocular area, initial encounter: Secondary | ICD-10-CM | POA: Diagnosis not present

## 2020-09-01 DIAGNOSIS — Y9301 Activity, walking, marching and hiking: Secondary | ICD-10-CM | POA: Insufficient documentation

## 2020-09-01 DIAGNOSIS — N189 Chronic kidney disease, unspecified: Secondary | ICD-10-CM | POA: Insufficient documentation

## 2020-09-01 DIAGNOSIS — W19XXXA Unspecified fall, initial encounter: Secondary | ICD-10-CM

## 2020-09-01 DIAGNOSIS — S0990XA Unspecified injury of head, initial encounter: Secondary | ICD-10-CM | POA: Diagnosis present

## 2020-09-01 DIAGNOSIS — Z79899 Other long term (current) drug therapy: Secondary | ICD-10-CM | POA: Insufficient documentation

## 2020-09-01 DIAGNOSIS — E1122 Type 2 diabetes mellitus with diabetic chronic kidney disease: Secondary | ICD-10-CM | POA: Diagnosis not present

## 2020-09-01 MED ORDER — TETANUS-DIPHTH-ACELL PERTUSSIS 5-2.5-18.5 LF-MCG/0.5 IM SUSY
0.5000 mL | PREFILLED_SYRINGE | Freq: Once | INTRAMUSCULAR | Status: AC
  Administered 2020-09-01: 0.5 mL via INTRAMUSCULAR
  Filled 2020-09-01: qty 0.5

## 2020-09-01 NOTE — ED Notes (Signed)
Pt calm , collective, denied pain or sob, ambulatory upon discharge  

## 2020-09-01 NOTE — Discharge Instructions (Addendum)
Follow-up with your primary care provider if any continued problems.  No fractures or head injury was seen on today's CT scans.  Clean the abrasions daily with mild soap and water and watch for any signs of infection.  A tetanus booster was given to you today.  Unless you have a severe injury that this should be sufficient for the next 10 years.

## 2020-09-01 NOTE — ED Notes (Signed)
See triage note  Presents s/p fall  States tripped over hose    Hitting pavement  Abrasion with swelling noted over left eye  Abrasion noted to left knee  Denies any LOC

## 2020-09-01 NOTE — ED Triage Notes (Signed)
Pt via POV from home. Pt c/o mechanical fall this AM around 0830, pt states he tripped on a water hose. Pt has an abrasion the L eyelid and abrasion to L knee. Denies blood thinners. Pt is A&Ox4 and NAD. Ambulatory to triage.

## 2020-09-01 NOTE — ED Provider Notes (Signed)
[facility] Emergency Department Provider Note  ____________________________________________   Event Date/Time   First MD Initiated Contact with Patient 09/01/20 1039     (approximate)  I have reviewed the triage vital signs and the nursing notes.   HISTORY  Chief Complaint Fall   HPI Paul SUIT Sr. is a 75 y.o. male resents to the ED after a mechanical fall this morning around 8:30 AM.  Patient states he tripped on a water hose.  He fell on concrete.  He has an abrasion over his left eye lid and abrasion to his left knee.  Patient states he takes aspirin daily.  He is uncertain of his last tetanus but gets physical exams at [facility].  He denies any loss of consciousness, vision changes, nausea or vomiting.  Currently rates pain as 0/10.         Past Medical History:  Diagnosis Date   Diabetes mellitus without complication (HCC)    Enlarged prostate    Hypertension    Renal insufficiency     Patient Active Problem List   Diagnosis Date Noted   Colon polyps 03/05/2014   H/O multiple allergies 12/01/2013   Erectile dysfunction 02/06/2013   Benign prostatic hyperplasia with lower urinary tract symptoms 02/06/2013   CKD (chronic kidney disease) 06/24/2012   OSA on CPAP 05/30/2012   Primary localized osteoarthrosis, lower leg 10/03/2010   Type II diabetes mellitus (HCC) 06/13/2008   CAD (coronary artery disease) 11/27/2007   Allergic rhinitis 11/02/2002   Gout 01/19/2001   Other specified cardiac arrhythmias 05/25/1989   Essential hypertension 02/23/1899    Past Surgical History:  Procedure Laterality Date   PROSTATE SURGERY     TONSILLECTOMY      Prior to Admission medications   Medication Sig Start Date End Date Taking? Authorizing Provider  acetaminophen (TYLENOL) 500 MG tablet Take by mouth.    [provider]  albuterol (PROVENTIL HFA;VENTOLIN HFA) 108 (90 Base) MCG/ACT inhaler Inhale into the lungs. 07/29/17    [provider]  amLODipine (NORVASC) 10 MG tablet  02/04/17   [provider]  aspirin 81 MG chewable tablet Chew 81 mg by mouth. 03/05/14   [provider]  atorvastatin (LIPITOR) 40 MG tablet  02/08/17   [provider]  chlorthalidone (HYGROTON) 25 MG tablet  12/29/16   [provider]  colchicine 0.6 MG tablet Take 1 tablet (0.6 mg total) by mouth daily. Take 2 tablets at onset of pain, then may take 1 more 1 hour later if pain continues. 07/28/15   Hagler, Jami L, PA-C  hydrALAZINE (APRESOLINE) 50 MG tablet  02/04/17   [provider]  lisinopril (PRINIVIL,ZESTRIL) 20 MG tablet  12/29/16   [provider]  ranitidine (ZANTAC) 150 MG tablet  01/20/17   [provider]    Allergies Orange oil, Bean pod extract, Corn-containing products, Fish allergy, Grapeseed extract [nutritional supplements], Sesame seed [sesame oil], and Proanthocyanidin  Family History  Problem Relation Age of Onset   Prostate cancer Brother    Kidney cancer Neg Hx    Bladder Cancer Neg Hx     Social History Social History   Tobacco Use   Smoking status: Former    Pack years: 0.00   Smokeless tobacco: Never  Substance Use Topics   Alcohol use: No   Drug use: No    Review of Systems Constitutional: No fever/chills Eyes: No visual changes. ENT: No trauma noted. Cardiovascular: Denies chest pain. Respiratory:  Denies shortness of breath. Gastrointestinal: No abdominal pain.  No nausea, no vomiting. Musculoskeletal: Positive for left knee pain. Skin: Positive for abrasions. Neurological: Negative for headaches, focal weakness or numbness.  ____________________________________________   PHYSICAL EXAM:  VITAL SIGNS: ED Triage Vitals  Enc Vitals Group     BP 09/01/20 1003 132/73     Pulse Rate 09/01/20 1003 80     Resp 09/01/20 1003 20     Temp 09/01/20 1003 98.3 F (36.8 C)     Temp Source 09/01/20 1003 Oral     SpO2 09/01/20  1003 100 %     Weight 09/01/20 1004 170 lb (77.1 kg)     Height 09/01/20 1004 5\' 7"  (1.702 m)     Head Circumference --      Peak Flow --      Pain Score 09/01/20 1004 0     Pain Loc --      Pain Edu? --      Excl. in GC? --     Constitutional: Alert and oriented. Well appearing and in no acute distress. Eyes: Conjunctivae are normal. PERRL. EOMI. abrasion and soft tissue edema over the left eyebrow.  No active bleeding and no foreign bodies noted. Head: Atraumatic. Nose: No congestion/rhinnorhea.  No edema or abrasions are noted. Mouth/Throat: Mucous membranes are moist.  Oropharynx non-erythematous. Neck: No stridor.  No tenderness noted on palpation cervical spine. Cardiovascular: Normal rate, regular rhythm. Grossly normal heart sounds.  Good peripheral circulation. Respiratory: Normal respiratory effort.  No retractions. Lungs CTAB. Gastrointestinal: Soft and nontender. No distention.  Bowel sounds normoactive x4 quadrants. Musculoskeletal: No tenderness is noted on palpation of the thoracic or lumbar spine.  No pelvic pain with compression.  Patient is able to move upper and lower extremities without any difficulty.  There is a superficial abrasion noted to the anterior portion of the patella without active bleeding.  Range of motion is slow but no deformity or effusion is present.  Pulses are present distally. Neurologic:  Normal speech and language. No gross focal neurologic deficits are appreciated.  Skin:  Skin is warm, dry abrasion to left eyebrow and left knee as noted above. Psychiatric: Mood and affect are normal. Speech and behavior are normal.  ____________________________________________   LABS (all labs ordered are listed, but only abnormal results are displayed)  Labs Reviewed - No data to display ____________________________________________   RADIOLOGY I, 11/02/20, personally viewed and evaluated these images (plain radiographs) as part of my medical  decision making, as well as reviewing the written report by the radiologist.   Official radiology report(s): CT Head Wo Contrast  Result Date: 09/01/2020 CLINICAL DATA:  Fall with facial trauma and left eye abrasion. EXAM: CT HEAD WITHOUT CONTRAST CT MAXILLOFACIAL WITHOUT CONTRAST CT CERVICAL SPINE WITHOUT CONTRAST TECHNIQUE: Multidetector CT imaging of the head, cervical spine, and maxillofacial structures were performed using the standard protocol without intravenous contrast. Multiplanar CT image reconstructions of the cervical spine and maxillofacial structures were also generated. COMPARISON:  Head CT 08/09/2020 and 11/11/2018 FINDINGS: CT HEAD FINDINGS Brain: Ventricles, cisterns and other CSF spaces are normal. No mass, mass effect, shift of midline structures or acute hemorrhage. 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 Vascular: No hyperdense vessel or unexpected calcification. Skull: Normal. Negative for fracture or focal lesion. Sinuses/Orbits: No acute finding. Other: Soft tissue swelling over the left periorbital/supraorbital region as well as right posterior parietal region. CT MAXILLOFACIAL FINDINGS Osseous: No facial bone fracture. Orbits: Globes and  retrobulbar spaces are normal. There is soft tissue swelling over the left periorbital region. Sinuses: Paranasal sinuses are well developed and well aerated without air-fluid level. Subtle mucosal membrane thickening over the floor the maxillary sinuses. Ostiomeatal complexes are patent. Soft tissues: Soft tissue swelling over the left periorbital/supraorbital region. CT CERVICAL SPINE FINDINGS Alignment: No posttraumatic subluxation. Subtle 1 mm anterior subluxation of C7 on T1 degenerative in nature. Skull base and vertebrae: Vertebral body heights are maintained. There is mild spondylosis throughout the cervical spine to include uncovertebral joint spurring and facet arthropathy. Mild right-sided neural foraminal narrowing at the C2-3 level.  Bilateral neural foraminal narrowing at the C3-4 level. Significant right-sided neural foraminal narrowing at the C4-5 level. Right-sided neural foraminal narrowing at the C5-6 and C6-7 levels. No acute fracture. Fusion of the right C2-3 facets. Chronic changes over the tip of the dens. Soft tissues and spinal canal: Prevertebral soft tissues are normal. No significant spinal canal stenosis. Disc levels: Minimal disc space narrowing at the C6-7 and C7-T1 levels. Upper chest: No acute findings. Other: None. IMPRESSION: 1. No acute brain injury. 2. No acute facial bone fracture. Soft tissue swelling over the left periorbital/supraorbital region and right posterior parietal region. 3. No acute cervical spine injury. 4. Mild spondylosis of the cervical spine with mild multilevel disc disease and multilevel neural foraminal narrowing as described. 5. Minimal chronic sinus inflammatory change. Electronically Signed   By: Elberta Fortis M.D.   On: 09/01/2020 12:11   CT Cervical Spine Wo Contrast  Result Date: 09/01/2020 CLINICAL DATA:  Fall with facial trauma and left eye abrasion. EXAM: CT HEAD WITHOUT CONTRAST CT MAXILLOFACIAL WITHOUT CONTRAST CT CERVICAL SPINE WITHOUT CONTRAST TECHNIQUE: Multidetector CT imaging of the head, cervical spine, and maxillofacial structures were performed using the standard protocol without intravenous contrast. Multiplanar CT image reconstructions of the cervical spine and maxillofacial structures were also generated. COMPARISON:  Head CT 08/09/2020 and 11/11/2018 FINDINGS: CT HEAD FINDINGS Brain: Ventricles, cisterns and other CSF spaces are normal. No mass, mass effect, shift of midline structures or acute hemorrhage. 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 Vascular: No hyperdense vessel or unexpected calcification. Skull: Normal. Negative for fracture or focal lesion. Sinuses/Orbits: No acute finding. Other: Soft tissue swelling over the left periorbital/supraorbital region as well as  right posterior parietal region. CT MAXILLOFACIAL FINDINGS Osseous: No facial bone fracture. Orbits: Globes and retrobulbar spaces are normal. There is soft tissue swelling over the left periorbital region. Sinuses: Paranasal sinuses are well developed and well aerated without air-fluid level. Subtle mucosal membrane thickening over the floor the maxillary sinuses. Ostiomeatal complexes are patent. Soft tissues: Soft tissue swelling over the left periorbital/supraorbital region. CT CERVICAL SPINE FINDINGS Alignment: No posttraumatic subluxation. Subtle 1 mm anterior subluxation of C7 on T1 degenerative in nature. Skull base and vertebrae: Vertebral body heights are maintained. There is mild spondylosis throughout the cervical spine to include uncovertebral joint spurring and facet arthropathy. Mild right-sided neural foraminal narrowing at the C2-3 level. Bilateral neural foraminal narrowing at the C3-4 level. Significant right-sided neural foraminal narrowing at the C4-5 level. Right-sided neural foraminal narrowing at the C5-6 and C6-7 levels. No acute fracture. Fusion of the right C2-3 facets. Chronic changes over the tip of the dens. Soft tissues and spinal canal: Prevertebral soft tissues are normal. No significant spinal canal stenosis. Disc levels: Minimal disc space narrowing at the C6-7 and C7-T1 levels. Upper chest: No acute findings. Other: None. IMPRESSION: 1. No acute brain injury. 2. No acute  facial bone fracture. Soft tissue swelling over the left periorbital/supraorbital region and right posterior parietal region. 3. No acute cervical spine injury. 4. Mild spondylosis of the cervical spine with mild multilevel disc disease and multilevel neural foraminal narrowing as described. 5. Minimal chronic sinus inflammatory change. Electronically Signed   By: Elberta Fortis M.D.   On: 09/01/2020 12:11   DG Knee Complete 4 Views Left  Result Date: 09/01/2020 CLINICAL DATA:  Fall this morning with left knee  pain. EXAM: LEFT KNEE - COMPLETE 4+ VIEW COMPARISON:  07/28/2015 FINDINGS: Chronic stable changes of the patella. Mild-to-moderate tricompartmental osteoarthritis. No significant joint effusion. No acute fracture or dislocation. Chondrocalcinosis over the mediolateral compartments. Atherosclerotic plaque over the visualized arteries. IMPRESSION: 1. No acute findings. 2. Mild-to-moderate tricompartmental osteoarthritis. Electronically Signed   By: Elberta Fortis M.D.   On: 09/01/2020 11:57   CT Maxillofacial Wo Contrast  Result Date: 09/01/2020 CLINICAL DATA:  Fall with facial trauma and left eye abrasion. EXAM: CT HEAD WITHOUT CONTRAST CT MAXILLOFACIAL WITHOUT CONTRAST CT CERVICAL SPINE WITHOUT CONTRAST TECHNIQUE: Multidetector CT imaging of the head, cervical spine, and maxillofacial structures were performed using the standard protocol without intravenous contrast. Multiplanar CT image reconstructions of the cervical spine and maxillofacial structures were also generated. COMPARISON:  Head CT 08/09/2020 and 11/11/2018 FINDINGS: CT HEAD FINDINGS Brain: Ventricles, cisterns and other CSF spaces are normal. No mass, mass effect, shift of midline structures or acute hemorrhage. 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 Vascular: No hyperdense vessel or unexpected calcification. Skull: Normal. Negative for fracture or focal lesion. Sinuses/Orbits: No acute finding. Other: Soft tissue swelling over the left periorbital/supraorbital region as well as right posterior parietal region. CT MAXILLOFACIAL FINDINGS Osseous: No facial bone fracture. Orbits: Globes and retrobulbar spaces are normal. There is soft tissue swelling over the left periorbital region. Sinuses: Paranasal sinuses are well developed and well aerated without air-fluid level. Subtle mucosal membrane thickening over the floor the maxillary sinuses. Ostiomeatal complexes are patent. Soft tissues: Soft tissue swelling over the left periorbital/supraorbital  region. CT CERVICAL SPINE FINDINGS Alignment: No posttraumatic subluxation. Subtle 1 mm anterior subluxation of C7 on T1 degenerative in nature. Skull base and vertebrae: Vertebral body heights are maintained. There is mild spondylosis throughout the cervical spine to include uncovertebral joint spurring and facet arthropathy. Mild right-sided neural foraminal narrowing at the C2-3 level. Bilateral neural foraminal narrowing at the C3-4 level. Significant right-sided neural foraminal narrowing at the C4-5 level. Right-sided neural foraminal narrowing at the C5-6 and C6-7 levels. No acute fracture. Fusion of the right C2-3 facets. Chronic changes over the tip of the dens. Soft tissues and spinal canal: Prevertebral soft tissues are normal. No significant spinal canal stenosis. Disc levels: Minimal disc space narrowing at the C6-7 and C7-T1 levels. Upper chest: No acute findings. Other: None. IMPRESSION: 1. No acute brain injury. 2. No acute facial bone fracture. Soft tissue swelling over the left periorbital/supraorbital region and right posterior parietal region. 3. No acute cervical spine injury. 4. Mild spondylosis of the cervical spine with mild multilevel disc disease and multilevel neural foraminal narrowing as described. 5. Minimal chronic sinus inflammatory change. Electronically Signed   By: Elberta Fortis M.D.   On: 09/01/2020 12:11    ____________________________________________   PROCEDURES  Procedure(s) performed (including Critical Care):  Procedures   ____________________________________________   INITIAL IMPRESSION / ASSESSMENT AND PLAN / ED COURSE  As part of my medical decision making, I reviewed the following data within the electronic medical  record:  Notes from prior ED visits and  Controlled Substance Database  75 year old male presents to the ED after mechanical fall that happened approximately 8:30 AM when he tripped over a water hose.  Patient denies any loss of  consciousness or head injury.  There is an abrasion with soft tissue edema noted to the left eyebrow.  There is also an abrasion to the left patella.  Patient currently takes an aspirin daily.  X-rays were reassuring and patient was made aware that his knee, CT head, CT cervical spine and maxillofacial were negative for any acute bony changes.  A tetanus booster was given as there was no documentation that he has had 1 in the last 10 years.  Patient was given instructions to clean these areas daily with mild soap and water and watch for any signs of infection.  He is to follow-up with his PCP if any continued problems or concerns.  Wife was made aware that he may return to the emergency department if any severe worsening of his symptoms or urgent concerns. ____________________________________________   FINAL CLINICAL IMPRESSION(S) / ED DIAGNOSES  Final diagnoses:  Facial contusion, initial encounter  Contusion of left knee, initial encounter  Abrasions of multiple sites  Fall, initial encounter     ED Discharge Orders     None        Note:  This document was prepared using Dragon voice recognition software and may include unintentional dictation errors.    Tommi RumpsSummers, Raylan Troiani L, PA-C 09/01/20 1349    Phineas SemenGoodman, Graydon, MD 09/01/20 1426

## 2021-09-17 IMAGING — CT CT CERVICAL SPINE W/O CM
3 of 4 series · 12 of 33 positions shown, 14 images · non-contrast
Comparison: Head CT 08/09/2020 and 11/11/2018

CLINICAL DATA: Fall with facial trauma and left eye abrasion.

EXAM:
CT HEAD WITHOUT CONTRAST
CT MAXILLOFACIAL WITHOUT CONTRAST
CT CERVICAL SPINE WITHOUT CONTRAST
TECHNIQUE: Multidetector CT imaging of the head, cervical spine, and
maxillofacial structures were performed using the standard protocol
without intravenous contrast. Multiplanar CT image reconstructions
of the cervical spine and maxillofacial structures were also
generated.

[Series 4: sagittal bone · sagittal · 0.37mm/px · 5 of 144 slices shown, 6 images]
[im 48/144  bone]
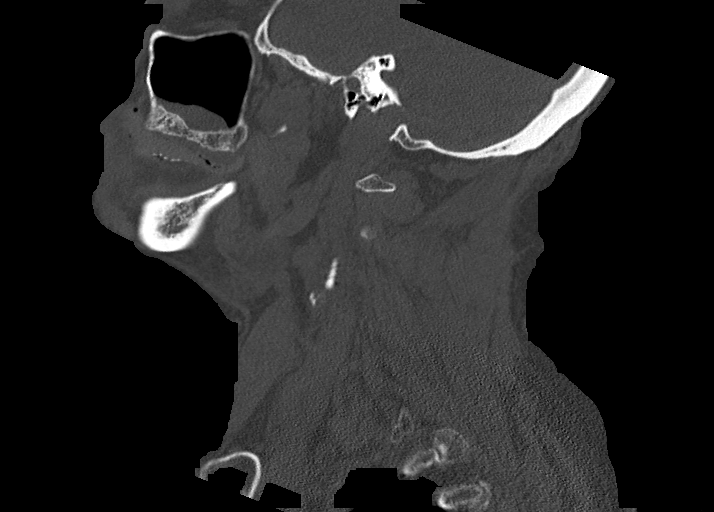
[im 60/144  bone]
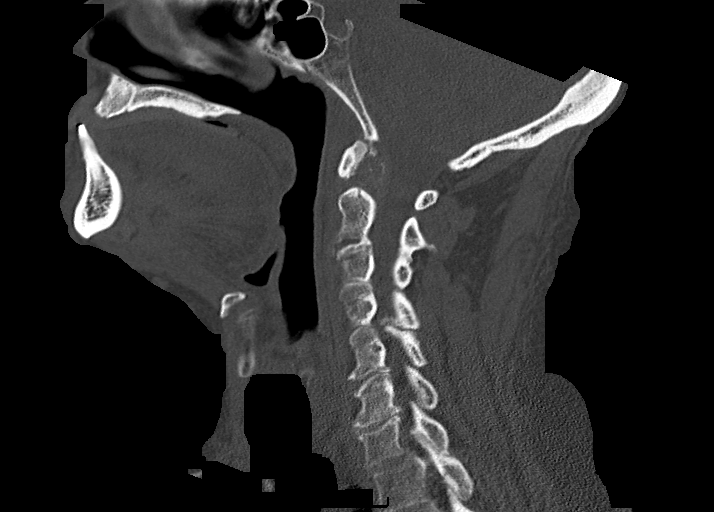
[im 72/144  soft-tissue]
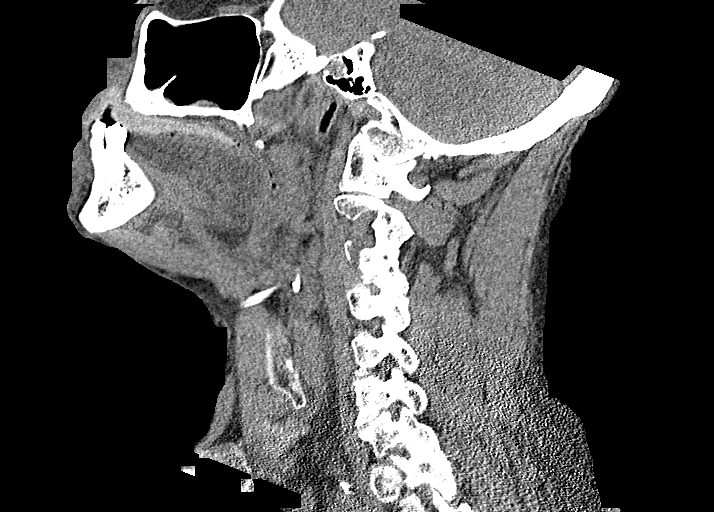
[im 72/144  bone]
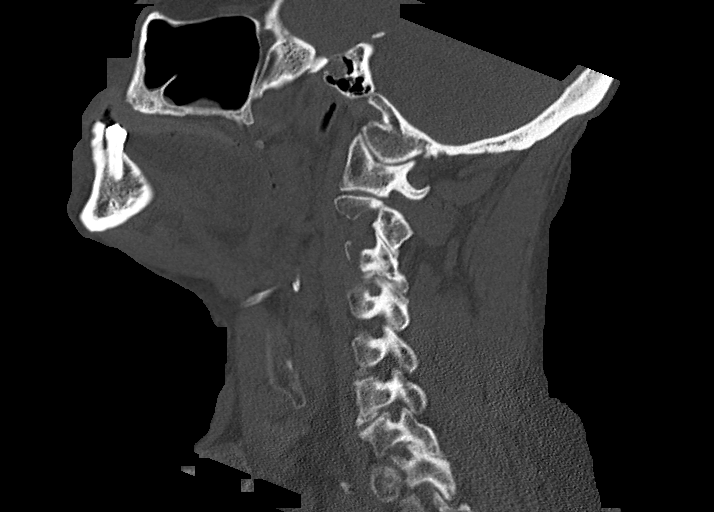
[im 84/144  bone]
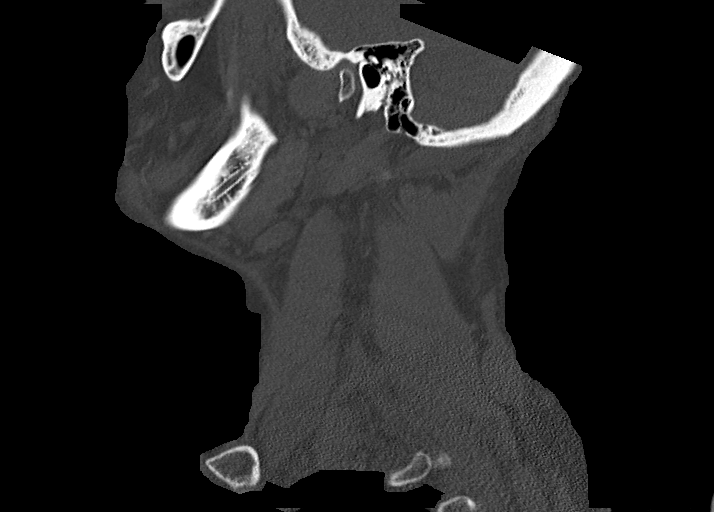
[im 96/144  bone]
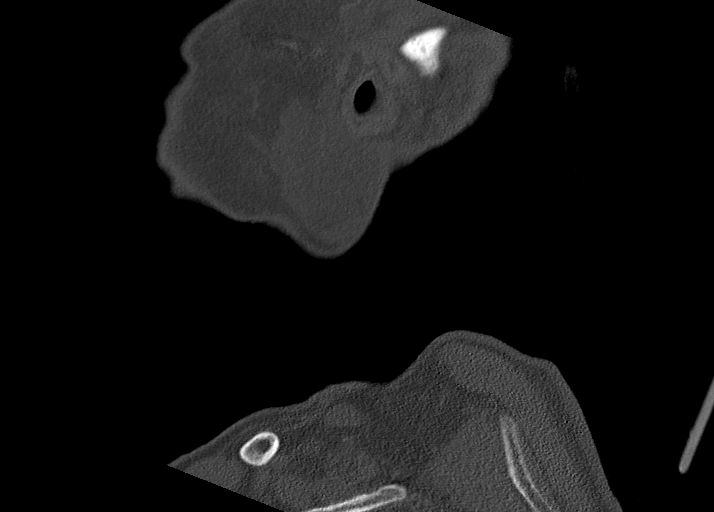

[Series 5: coronal bone · coronal · 0.40mm/px · 3 of 133 slices shown]
[im 27/133  bone]
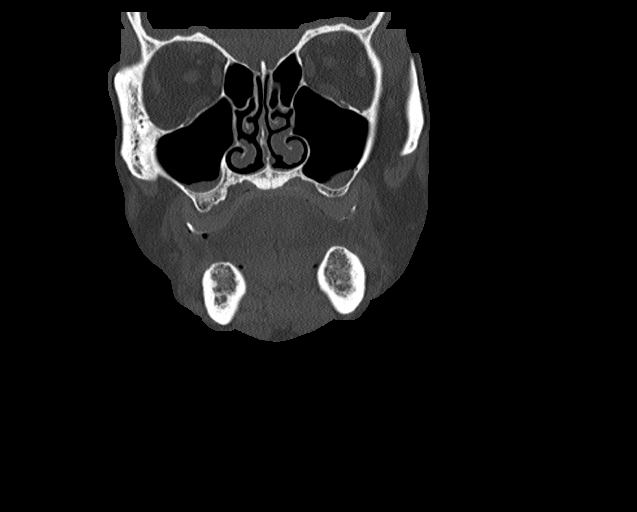
[im 53/133  bone]
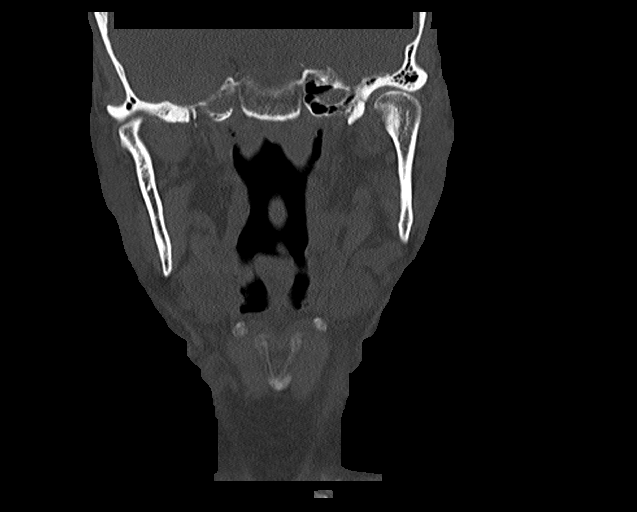
[im 80/133  bone]
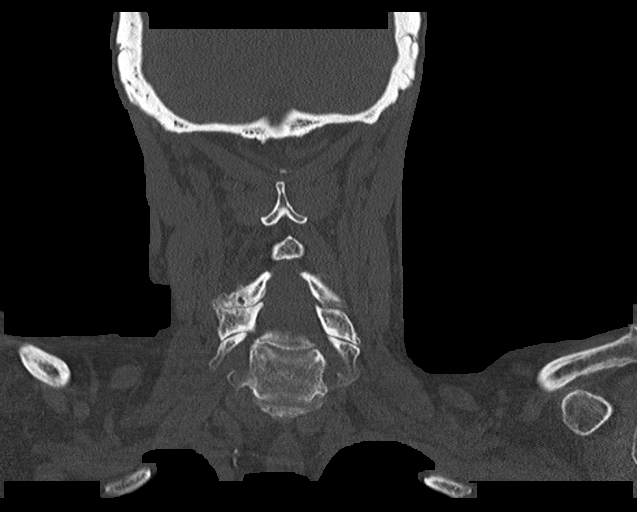

[Series 6: orthogonal axials · axial · 0.39mm/px · z∈[-272,-189]mm · 4 of 78 slices shown, 5 images]
[im 16/78  soft-tissue]
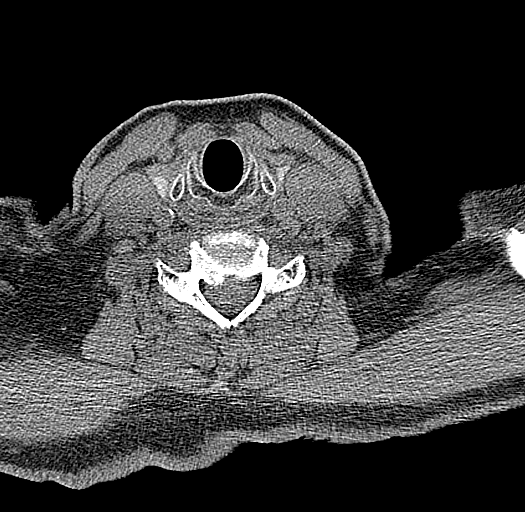
[im 16/78  bone]
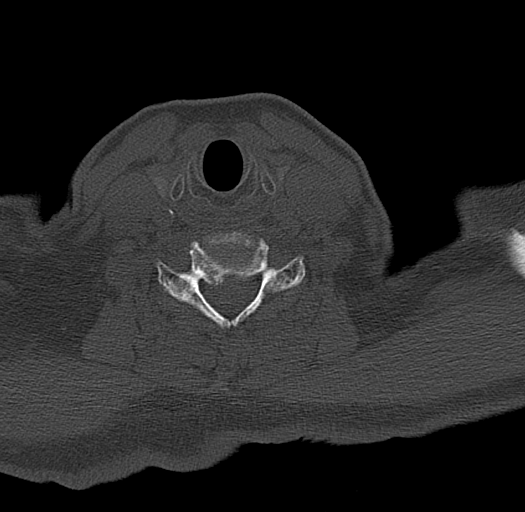
[im 31/78  bone]
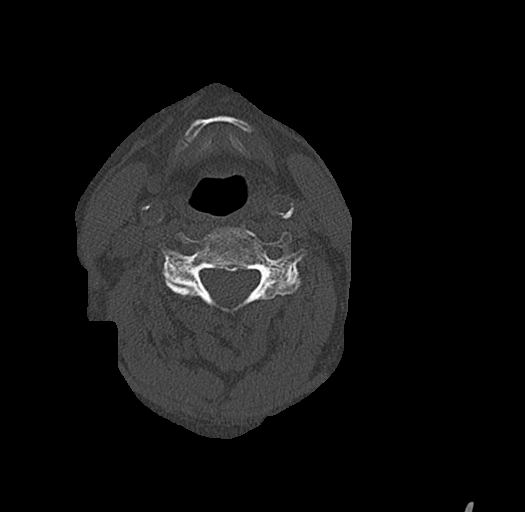
[im 47/78  bone]
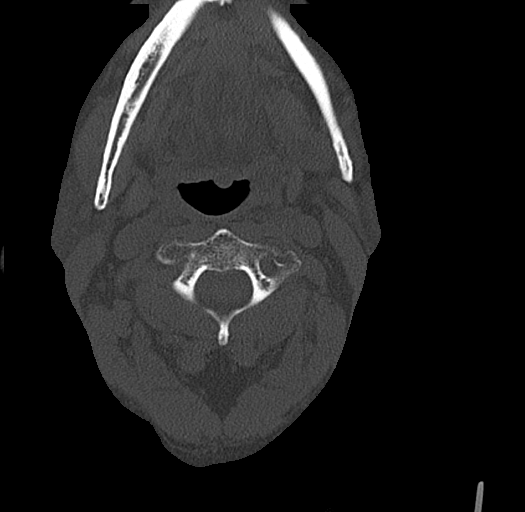
[im 62/78  bone]
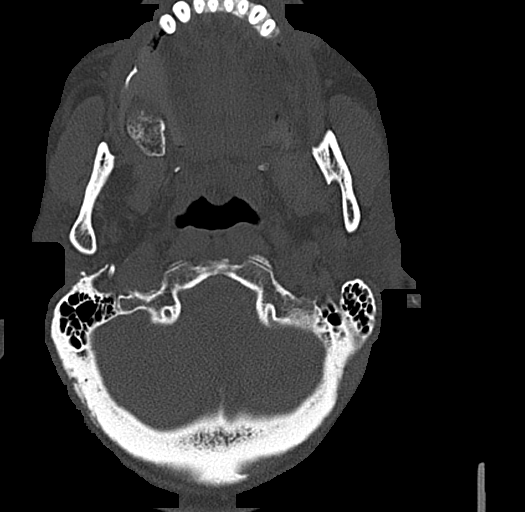

[12 of 33 positions shown; findings below may reference images not displayed]

FINDINGS: CT HEAD FINDINGS

Brain: Ventricles, cisterns and other CSF spaces are normal. No
mass, mass effect, shift of midline structures or acute hemorrhage.
Old lacunar infarct over the left pons.

Vascular: No hyperdense vessel or unexpected calcification.

Skull: Normal. Negative for fracture or focal lesion.

Sinuses/Orbits: No acute finding.

Other: Soft tissue swelling over the left periorbital/supraorbital
region as well as right posterior parietal region.

CT MAXILLOFACIAL FINDINGS

Osseous: No facial bone fracture.

Orbits: Globes and retrobulbar spaces are normal. There is soft
tissue swelling over the left periorbital region.

Sinuses: Paranasal sinuses are well developed and well aerated
without air-fluid level. Subtle mucosal membrane thickening over the
floor the maxillary sinuses. Ostiomeatal complexes are patent.

Soft tissues: Soft tissue swelling over the left
periorbital/supraorbital region.

CT CERVICAL SPINE FINDINGS

Alignment: No posttraumatic subluxation. Subtle 1 mm anterior
subluxation of C7 on T1 degenerative in nature.

Skull base and vertebrae: Vertebral body heights are maintained.
There is mild spondylosis throughout the cervical spine to include
uncovertebral joint spurring and facet arthropathy. Mild right-sided
neural foraminal narrowing at the C2-3 level. Bilateral neural
foraminal narrowing at the C3-4 level. Significant right-sided
neural foraminal narrowing at the C4-5 level. Right-sided neural
foraminal narrowing at the C5-6 and C6-7 levels. No acute fracture.
Fusion of the right C2-3 facets. Chronic changes over the tip of the
dens.

Soft tissues and spinal canal: Prevertebral soft tissues are normal.
No significant spinal canal stenosis.

Disc levels: Minimal disc space narrowing at the C6-7 and C7-T1
levels.

Upper chest: No acute findings.

Other: None.
IMPRESSION: 1. No acute brain injury.
2. No acute facial bone fracture. Soft tissue swelling over the left
periorbital/supraorbital region and right posterior parietal region.
3. No acute cervical spine injury.
4. Mild spondylosis of the cervical spine with mild multilevel disc
disease and multilevel neural foraminal narrowing as described.
5. Minimal chronic sinus inflammatory change.

## 2021-11-28 ENCOUNTER — Encounter: Payer: Self-pay | Admitting: Emergency Medicine

## 2021-11-28 ENCOUNTER — Other Ambulatory Visit: Payer: Self-pay

## 2021-11-28 ENCOUNTER — Ambulatory Visit
Admission: EM | Admit: 2021-11-28 | Discharge: 2021-11-28 | Disposition: A | Discharge status: Home / Self Care | Payer: Medicare PPO | Attending: Emergency Medicine | Admitting: Emergency Medicine

## 2021-11-28 DIAGNOSIS — Z20822 Contact with and (suspected) exposure to covid-19: Secondary | ICD-10-CM

## 2021-11-28 DIAGNOSIS — Z1152 Encounter for screening for COVID-19: Secondary | ICD-10-CM | POA: Diagnosis present

## 2021-11-28 LAB — SARS CORONAVIRUS 2 BY RT PCR: SARS Coronavirus 2 by RT PCR: NEGATIVE

## 2021-11-28 NOTE — Discharge Instructions (Addendum)
Your test today in clinic was negative for COVID.  I will encourage you to continue and have your wife and grandson quarantine separately away from you until after the 5-day has passed.  I would still encourage him to wear a mask around you, or you wear a mask around them, for an additional 5 days.  If you develop any respiratory symptoms please return for reevaluation.

## 2021-11-28 NOTE — ED Provider Notes (Signed)
[facility]    CSN: [id number] Arrival date & time: 11/28/21  1027      History   Chief Complaint Chief Complaint  Patient presents with   Covid Exposure    No symptoms    HPI Paul GRIMA Sr. is a 76 y.o. male.   HPI  76 year old male here requesting COVID testing.  Patient reports that his wife and grandson both tested positive for COVID but he tested negative.  He states he is not having any symptoms and he denies specifically, fever, runny nose, nasal congestion, sore throat, cough, shortness breath, wheezing, nausea, vomiting, diarrhea, body aches, or headache.  Patient does have a significant past medical history to include CAD, CKD, essential hypertension, obstructive sleep apnea on CPAP, and type 2 diabetes.  Patient comorbidities patient would qualify for antiviral therapy should his test be positive today.  Past Medical History:  Diagnosis Date   Diabetes mellitus without complication (HCC)    Enlarged prostate    Hypertension    Renal insufficiency     Patient Active Problem List   Diagnosis Date Noted   Colon polyps 03/05/2014   H/O multiple allergies 12/01/2013   Erectile dysfunction 02/06/2013   Benign prostatic hyperplasia with lower urinary tract symptoms 02/06/2013   CKD (chronic kidney disease) 06/24/2012   OSA on CPAP 05/30/2012   Primary localized osteoarthrosis, lower leg 10/03/2010   Type II diabetes mellitus (HCC) 06/13/2008   CAD (coronary artery disease) 11/27/2007   Allergic rhinitis 11/02/2002   Gout 01/19/2001   Other specified cardiac arrhythmias 05/25/1989   Essential hypertension 02/23/1899    Past Surgical History:  Procedure Laterality Date   PROSTATE SURGERY     TONSILLECTOMY         Home Medications    Prior to Admission medications   Medication Sig Start Date End Date Taking? Authorizing Provider  amLODipine (NORVASC) 10 MG tablet  02/04/17  Yes [provider]  aspirin 81 MG chewable tablet  Chew 81 mg by mouth. 03/05/14  Yes [provider]  atorvastatin (LIPITOR) 40 MG tablet  02/08/17  Yes [provider]  chlorthalidone (HYGROTON) 25 MG tablet  12/29/16  Yes [provider]  colchicine 0.6 MG tablet Take 1 tablet (0.6 mg total) by mouth daily. Take 2 tablets at onset of pain, then may take 1 more 1 hour later if pain continues. 07/28/15  Yes Hagler, Jami L, PA-C  lisinopril (PRINIVIL,ZESTRIL) 20 MG tablet  12/29/16  Yes [provider]  ranitidine (ZANTAC) 150 MG tablet  01/20/17  Yes [provider]  acetaminophen (TYLENOL) 500 MG tablet Take by mouth.    [provider]  albuterol (PROVENTIL HFA;VENTOLIN HFA) 108 (90 Base) MCG/ACT inhaler Inhale into the lungs. 07/29/17   [provider]  allopurinol (ZYLOPRIM) 100 MG tablet Take 100 mg by mouth every morning. 09/11/21   [provider]    Family History Family History  Problem Relation Age of Onset   Prostate cancer Brother    Kidney cancer Neg Hx    Bladder Cancer Neg Hx     Social History Social History   Tobacco Use   Smoking status: Former   Smokeless tobacco: Never  Building services engineer Use: Never used  Substance Use Topics   Alcohol use: No   Drug use: No     Allergies   Orange oil, Bean pod extract, Corn-containing products, Fish allergy, Grapeseed extract [nutritional supplements], Sesame seed [sesame oil], and  Proanthocyanidin   Review of Systems Review of Systems  Constitutional:  Negative for fever.  HENT:  Negative for congestion, ear pain, rhinorrhea and sore throat.   Respiratory:  Negative for cough, shortness of breath and wheezing.   Gastrointestinal:  Negative for diarrhea, nausea and vomiting.  Musculoskeletal:  Negative for arthralgias and myalgias.  Skin:  Negative for rash.  Neurological:  Negative for headaches.  Hematological: Negative.   Psychiatric/Behavioral: Negative.       Physical Exam Triage Vital  Signs ED Triage Vitals  Enc Vitals Group     BP 11/28/21 1009 131/76     Pulse Rate 11/28/21 1009 60     Resp 11/28/21 1009 15     Temp 11/28/21 1009 98.6 F (37 C)     Temp Source 11/28/21 1009 Oral     SpO2 11/28/21 1009 97 %     Weight 11/28/21 1007 170 lb (77.1 kg)     Height 11/28/21 1007 5\' 7"  (1.702 m)     Head Circumference --      Peak Flow --      Pain Score 11/28/21 1007 0     Pain Loc --      Pain Edu? --      Excl. in [place]? --    No data found.  Updated Vital Signs BP 131/76 (BP Location: Left Arm)   Pulse 60   Temp 98.6 F (37 C) (Oral)   Resp 15   Ht 5\' 7"  (1.702 m)   Wt 170 lb (77.1 kg)   SpO2 97%   BMI 26.63 kg/m   Visual Acuity Right Eye Distance:   Left Eye Distance:   Bilateral Distance:    Right Eye Near:   Left Eye Near:    Bilateral Near:     Physical Exam Vitals and nursing note reviewed.  Constitutional:      Appearance: Normal appearance. He is not ill-appearing.  HENT:     Head: Normocephalic and atraumatic.     Right Ear: Tympanic membrane, ear canal and external ear normal. There is no impacted cerumen.     Left Ear: Tympanic membrane, ear canal and external ear normal. There is no impacted cerumen.     Nose: Nose normal. No congestion or rhinorrhea.     Mouth/Throat:     Mouth: Mucous membranes are moist.     Pharynx: Oropharynx is clear. No oropharyngeal exudate or posterior oropharyngeal erythema.  Cardiovascular:     Rate and Rhythm: Normal rate and regular rhythm.     Pulses: Normal pulses.     Heart sounds: Normal heart sounds. No murmur heard.    No friction rub. No gallop.  Pulmonary:     Effort: Pulmonary effort is normal.     Breath sounds: Normal breath sounds. No wheezing, rhonchi or rales.  Musculoskeletal:     Cervical back: Normal range of motion and neck supple.  Lymphadenopathy:     Cervical: No cervical adenopathy.  Skin:    General: Skin is warm and dry.     Capillary Refill: Capillary refill takes less  than 2 seconds.     Findings: No erythema or rash.  Neurological:     General: No focal deficit present.     Mental Status: He is alert and oriented to person, place, and time.  Psychiatric:        Mood and Affect: Mood normal.        Behavior: Behavior normal.  Thought Content: Thought content normal.        Judgment: Judgment normal.      UC Treatments / Results  Labs (all labs ordered are listed, but only abnormal results are displayed) Labs Reviewed  SARS CORONAVIRUS 2 BY RT PCR    EKG   Radiology No results found.  Procedures Procedures (including critical care time)  Medications Ordered in UC Medications - No data to display  Initial Impression / Assessment and Plan / UC Course  I have reviewed the triage vital signs and the nursing notes.  Pertinent labs & imaging results that were available during my care of the patient were reviewed by me and considered in my medical decision making (see chart for details).   Patient is a pleasant, nontoxic-appearing 76 year old male with a significant past medical history to include CAD, CKD, OSA on CPAP, type 2 diabetes, and hypertension presenting for COVID PCR testing.  He did take an at home test and was negative but his wife and grandson are both tested positive.  Patient's respiratory exam is completely benign.  I will order a COVID PCR and if patient test COVID-positive he is a candidate for antiviral therapy.  Patient has a CMP in epic from 09/15/2021 which shows a GFR of 39.  He does qualify for renal dosing of Paxlovid.  COVID PCR is negative.  I will discharge patient home and have him continue to remain separate from his wife and grandson until after their 5-day quarantine duration.   Final Clinical Impressions(s) / UC Diagnoses   Final diagnoses:  Encounter for screening laboratory testing for COVID-19 virus     Discharge Instructions      Your test today in clinic was negative for COVID.  I will  encourage you to continue and have your wife and grandson quarantine separately away from you until after the 5-day has passed.  I would still encourage him to wear a mask around you, or you wear a mask around them, for an additional 5 days.  If you develop any respiratory symptoms please return for reevaluation.     ED Prescriptions   None    PDMP not reviewed this encounter.   Margarette Canada, NP 11/28/21 1054

## 2021-11-28 NOTE — ED Triage Notes (Signed)
Patient has no symptoms.  Patient states that his wife and grandson both tested positive for covid at home yesterday.  Patient states that his home test for covid was negative.

## 2022-03-30 ENCOUNTER — Other Ambulatory Visit: Payer: Self-pay

## 2022-03-30 ENCOUNTER — Emergency Department
Admission: EM | Admit: 2022-03-30 | Discharge: 2022-03-30 | Disposition: A | Discharge status: Home / Self Care | Payer: Medicare PPO | Attending: Emergency Medicine | Admitting: Emergency Medicine

## 2022-03-30 DIAGNOSIS — R600 Localized edema: Secondary | ICD-10-CM | POA: Insufficient documentation

## 2022-03-30 DIAGNOSIS — I129 Hypertensive chronic kidney disease with stage 1 through stage 4 chronic kidney disease, or unspecified chronic kidney disease: Secondary | ICD-10-CM | POA: Insufficient documentation

## 2022-03-30 DIAGNOSIS — R2243 Localized swelling, mass and lump, lower limb, bilateral: Secondary | ICD-10-CM | POA: Diagnosis present

## 2022-03-30 DIAGNOSIS — N189 Chronic kidney disease, unspecified: Secondary | ICD-10-CM | POA: Diagnosis not present

## 2022-03-30 LAB — BASIC METABOLIC PANEL
Anion gap: 6 (ref 5–15)
BUN: 24 mg/dL — ABNORMAL HIGH (ref 8–23)
CO2: 27 mmol/L (ref 22–32)
Calcium: 9.1 mg/dL (ref 8.9–10.3)
Chloride: 107 mmol/L (ref 98–111)
Creatinine, Ser: 1.71 mg/dL — ABNORMAL HIGH (ref 0.61–1.24)
GFR, Estimated: 40 mL/min — ABNORMAL LOW (ref >60)
Glucose, Bld: 117 mg/dL — ABNORMAL HIGH (ref 70–99)
Potassium: 4.6 mmol/L (ref 3.5–5.1)
Sodium: 140 mmol/L (ref 135–145)

## 2022-03-30 LAB — CBC WITH DIFFERENTIAL/PLATELET
Abs Immature Granulocytes: 0.05 10*3/uL (ref 0.00–0.07)
Basophils Absolute: 0.1 10*3/uL (ref 0.0–0.1)
Basophils Relative: 1 %
Eosinophils Absolute: 0.4 10*3/uL (ref 0.0–0.5)
Eosinophils Relative: 5 %
HCT: 32.3 % — ABNORMAL LOW (ref 39.0–52.0)
Hemoglobin: 10.1 g/dL — ABNORMAL LOW (ref 13.0–17.0)
Immature Granulocytes: 1 %
Lymphocytes Relative: 24 %
Lymphs Abs: 2 10*3/uL (ref 0.7–4.0)
MCH: 32.1 pg (ref 26.0–34.0)
MCHC: 31.3 g/dL (ref 30.0–36.0)
MCV: 102.5 fL — ABNORMAL HIGH (ref 80.0–100.0)
Monocytes Absolute: 0.8 10*3/uL (ref 0.1–1.0)
Monocytes Relative: 10 %
Neutro Abs: 5.1 10*3/uL (ref 1.7–7.7)
Neutrophils Relative %: 59 %
Platelets: 246 10*3/uL (ref 150–400)
RBC: 3.15 MIL/uL — ABNORMAL LOW (ref 4.22–5.81)
RDW: 13.5 % (ref 11.5–15.5)
WBC: 8.4 10*3/uL (ref 4.0–10.5)
nRBC: 0 % (ref 0.0–0.2)

## 2022-03-30 LAB — BRAIN NATRIURETIC PEPTIDE: B Natriuretic Peptide: 120.3 pg/mL — ABNORMAL HIGH (ref 0.0–100.0)

## 2022-03-30 LAB — TROPONIN I (HIGH SENSITIVITY): Troponin I (High Sensitivity): 8 ng/L (ref <18)

## 2022-03-30 MED ORDER — DILTIAZEM HCL 30 MG PO TABS: 30.0000 mg | ORAL_TABLET | Freq: Two times a day (BID) | ORAL | 0 refills | Status: DC

## 2022-03-30 NOTE — Discharge Instructions (Signed)
Please stop taking your amlodipine and start taking diltiazem (Cardizem) twice a day.  Please take your blood pressure for the next week approximately 1 hour after taking her medications.  If you find your systolic blood pressure (top number) to be greater than 100, you may take the second dose of that day and if not please only take 1/day.

## 2022-03-30 NOTE — ED Provider Notes (Signed)
[facility] Provider Note   Event Date/Time   First MD Initiated Contact with Patient 03/30/22 1818     (approximate) History  Leg Swelling (Patient reports bilateral leg swelling that began approx. 1 week ago; Denies pain, he states he does not have CHF or vascular insufficiency; Does have a history of gout but states that the swelling is in his lower legs and feet and not in his joints)  HPI ELIZA GRISSINGER Sr. is a 77 y.o. male with a stated past medical history of hypertension and CKD who presents for bilateral lower extremity swelling that has been present for 2 weeks.  Patient states the symptoms began shortly after stopping chlorthalidone as he is concerned that this medication caused the symptoms.  Patient states that he is taking all of his other medications on time and as prescribed.  Patient denies any dyspnea on exertion, orthopnea, or lower extremity pain ROS: Patient currently denies any vision changes, tinnitus, difficulty speaking, facial droop, sore throat, chest pain, shortness of breath, abdominal pain, nausea/vomiting/diarrhea, dysuria, or weakness/numbness/paresthesias in any extremity   Physical Exam  Triage Vital Signs: ED Triage Vitals  Enc Vitals Group     BP 03/30/22 1714 137/72     Pulse Rate 03/30/22 1714 62     Resp 03/30/22 1714 17     Temp 03/30/22 1714 98.9 F (37.2 C)     Temp src --      SpO2 03/30/22 1714 98 %     Weight 03/30/22 1721 160 lb (72.6 kg)     Height 03/30/22 1721 5\' 7"  (1.702 m)     Head Circumference --      Peak Flow --      Pain Score 03/30/22 1721 0     Pain Loc --      Pain Edu? --      Excl. in [place]? --    Most recent vital signs: Vitals:   03/30/22 1714  BP: 137/72  Pulse: 62  Resp: 17  Temp: 98.9 F (37.2 C)  SpO2: 98%   General: Awake, oriented x4. CV:  Good peripheral perfusion.  Resp:  Normal effort.  Abd:  No distention.  Other:  Elderly overweight African-American male laying in bed in  no acute distress.  2+ pitting edema bilateral lower extremities ED Results / Procedures / Treatments  Labs (all labs ordered are listed, but only abnormal results are displayed) Labs Reviewed  BRAIN NATRIURETIC PEPTIDE - Abnormal; Notable for the following components:      Result Value   B Natriuretic Peptide 120.3 (*)    All other components within normal limits  BASIC METABOLIC PANEL - Abnormal; Notable for the following components:   Glucose, Bld 117 (*)    BUN 24 (*)    Creatinine, Ser 1.71 (*)    GFR, Estimated 40 (*)    All other components within normal limits  CBC WITH DIFFERENTIAL/PLATELET - Abnormal; Notable for the following components:   RBC 3.15 (*)    Hemoglobin 10.1 (*)    HCT 32.3 (*)    MCV 102.5 (*)    All other components within normal limits  TROPONIN I (HIGH SENSITIVITY)   EKG ED ECG REPORT I, Naaman Plummer, the attending physician, personally viewed and interpreted this ECG. Date: 03/30/2022 EKG Time: 1721 Rate: 61 Rhythm: normal sinus rhythm QRS Axis: normal Intervals: normal ST/T Wave abnormalities: normal Narrative Interpretation: no evidence of acute ischemia ROCEDURES: Critical Care performed: No  Procedures MEDICATIONS ORDERED IN ED: Medications - No data to display IMPRESSION / MDM / [place] / ED COURSE  I reviewed the triage vital signs and the nursing notes.                             Patient's presentation is most consistent with acute presentation with potential threat to life or bodily function.  This patient presents to the ED for concern of bilateral lower extremity edema, this involves an extensive number of treatment options, and is a complaint that carries with it a high risk of complications and morbidity.  The differential diagnosis includes acute renal failure, CHF exacerbation, phlegmasia dolens, arterial occlusion Co morbidities that complicate the patient evaluation  Hypertension, chronic kidney  disease Additional history obtained:  Additional history obtained from patient  External records from outside source obtained and reviewed including nephrology note from 03/16/2022 Lab Tests:  I Ordered, and personally interpreted labs.  The pertinent results include: Creatinine 1.71, hemoglobin 10.1 with hematocrit of 30.2 Cardiac Monitoring: / EKG:  The patient was maintained on a cardiac monitor.  I personally viewed and interpreted the cardiac monitored which showed an underlying rhythm of: NSR Problem List / ED Course / Critical interventions / Medication management  Bilateral lower extremity swelling  I have reviewed the patients home medicines and have made adjustments as needed including cessation of patient's amlodipine and starting a low-dose diltiazem with instructions to use increasing dosing as tolerated using a blood pressure cuff at home as well as instructions to follow-up with his nephrologist as this seems to be who has been managing his antihypertensive regimen Social Determinants of Health:  Tobacco abuse Test / Admission - Considered:  I considered admission for this patient however he is not requiring any supplemental oxygen, showing no signs of respiratory distress, or EKG/troponin evidence of cardiac ischemia Dispo: Discharge home with PCP/nephrology follow-up       FINAL CLINICAL IMPRESSION(S) / ED DIAGNOSES   Final diagnoses:  Bilateral lower extremity edema   Rx / DC Orders   ED Discharge Orders          Ordered    diltiazem (CARDIZEM) 30 MG tablet  2 times daily        03/30/22 1931           Note:  This document was prepared using Dragon voice recognition software and may include unintentional dictation errors.   Naaman Plummer, MD 03/30/22 [phone]

## 2022-03-30 NOTE — ED Triage Notes (Signed)
Patient reports bilateral leg swelling that began approx. 1 week ago; Denies pain, he states he does not have CHF or vascular insufficiency; Does have a history of gout but states that the swelling is in his lower legs and feet and not in his joints

## 2022-04-02 NOTE — Congregational Nurse Program (Signed)
  Dept: [phone]   Congregational Nurse Program Note  Date of Encounter: 04/02/2022  Past Medical History: Past Medical History:  Diagnosis Date   Diabetes mellitus without complication ([place])    Enlarged prostate    Hypertension    Renal insufficiency     Encounter Details:  CNP Questionnaire - 04/01/22 1230       Questionnaire   Ask client: Do you give verbal consent for me to treat you today? Yes    Student Assistance N/A    Location Patient Film/video editor, [employer]    Visit Setting with Client Organization    Patient Status Unknown    Insurance Medicare    Insurance/Financial Assistance Referral N/A    Medication N/A    Medical Provider Yes    Screening Referrals Made N/A    Medical Referrals Made Non-[facility] PCP/Clinic    Medical Appointment Made N/A    Recently w/o PCP, now 1st time PCP visit completed due to CNs referral or appointment made N/A    Food N/A    Transportation N/A    Housing/Utilities N/A    Interpersonal Safety N/A    Interventions Advocate/Support;Counsel;Educate    Abnormal to Normal Screening Since Last CN Visit N/A    Screenings CN Performed Blood Pressure    Sent Client to Lab for: N/A    Did client attend any of the following based off CNs referral or appointments made? N/A    ED Visit Averted N/A    Life-Saving Intervention Made N/A            On 04/01/22 client came into nurse only clinic requesting BP check as he was seen yesterday at ED re swollen feet and BP elevation. Added fluid pill bid per MD. To schedule follow up with Internal Medicine @ [facility] in [place]. Took dose this morning with Lisinopril. BP elevated at 170/90 and recheck after 1 hour 165/84. Slight edema to ankles bilaterally, no pitting. Offered to call clinic with today's BP results, client states he will follow up. States he will take second water pill as prescribed, recheck BP at home, and call MD to schedule appt ASAP. Co re signs and symptoms  requiring immediate medical care: headache, change in vision, change in facial sensations, change in strength, change in speech and/or cardiac symptoms. Client verbalizes understanding. Follow up with this nurse in 1 wk. Rhermann, RN

## 2022-07-24 ENCOUNTER — Encounter: Payer: Self-pay | Admitting: Urology

## 2022-07-24 ENCOUNTER — Ambulatory Visit: Payer: Medicare PPO | Admitting: Urology

## 2022-07-24 VITALS — BP 142/76 | HR 48 | Ht 67.0 in | Wt 160.0 lb

## 2022-07-24 DIAGNOSIS — K4090 Unilateral inguinal hernia, without obstruction or gangrene, not specified as recurrent: Secondary | ICD-10-CM | POA: Diagnosis not present

## 2022-07-24 DIAGNOSIS — N433 Hydrocele, unspecified: Secondary | ICD-10-CM

## 2022-07-24 DIAGNOSIS — N401 Enlarged prostate with lower urinary tract symptoms: Secondary | ICD-10-CM | POA: Diagnosis not present

## 2022-07-24 LAB — URINALYSIS, COMPLETE
Bilirubin, UA: NEGATIVE
Glucose, UA: NEGATIVE
Ketones, UA: NEGATIVE
Leukocytes,UA: NEGATIVE
Nitrite, UA: NEGATIVE
RBC, UA: NEGATIVE
Specific Gravity, UA: 1.02 (ref 1.005–1.030)
Urobilinogen, Ur: 0.2 mg/dL (ref 0.2–1.0)
pH, UA: 6 (ref 5.0–7.5)

## 2022-07-24 LAB — MICROSCOPIC EXAMINATION

## 2022-07-24 NOTE — Progress Notes (Signed)
I, Maysun L Gibbs,acting as a scribe for Paul Altes, MD.,have documented all relevant documentation on the behalf of Paul Altes, MD,as directed by  Paul Altes, MD while in the presence of Paul Altes, MD.  07/24/2022 9:35 AM   Paul Bongo Sr. [birth year] [phone]   Chief Complaint  Patient presents with   Benign Prostatic Hypertrophy    HPI: Paul SPRAGG Sr. is a 77 y.o. male self-referred for evaluation of abdominal pain.  Long history of BPH and previously followed by me for BPH and erectile dysfunction. 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 I last saw him January 2020, he was doing well and following up as needed. 2 week history of right groin pain and he has noted a bulge. No bothersome LUTS. Pain is mild and worse with lifting.   PMH: Past Medical History:  Diagnosis Date   Diabetes mellitus without complication (HCC)    Enlarged prostate    Hypertension    Renal insufficiency     Surgical History: Past Surgical History:  Procedure Laterality Date   PROSTATE SURGERY     TONSILLECTOMY      Home Medications:  Allergies as of 07/24/2022       Reactions   Orange Oil Swelling   Bean Pod Extract    Corn-containing Products    Famotidine    Fish Allergy    Grapeseed Extract [nutritional Supplements]    Sesame Seed [sesame Oil]    Proanthocyanidin Itching        Medication List        Accurate as of Jul 24, 2022  9:35 AM. If you have any questions, ask your nurse or doctor.          STOP taking these medications    colchicine 0.6 MG tablet Stopped by: Paul Altes, MD   diltiazem 30 MG tablet Commonly known as: Cardizem Stopped by: Paul Altes, MD   lisinopril 20 MG tablet Commonly known as: ZESTRIL Stopped by: Paul Altes, MD       TAKE these medications    acetaminophen 500 MG tablet Commonly known as: TYLENOL Take by mouth.   albuterol 108 (90 Base) MCG/ACT  inhaler Commonly known as: VENTOLIN HFA Inhale into the lungs.   allopurinol 100 MG tablet Commonly known as: ZYLOPRIM Take 100 mg by mouth every morning.   amLODipine 10 MG tablet Commonly known as: NORVASC Take 1 tablet by mouth daily.   aspirin 81 MG chewable tablet Chew 81 mg by mouth.   atorvastatin 40 MG tablet Commonly known as: LIPITOR   Baclofen 5 MG Tabs Take by mouth.   chlorthalidone 25 MG tablet Commonly known as: HYGROTON   DULoxetine 30 MG capsule Commonly known as: CYMBALTA Take 30 mg by mouth daily.   propranolol 20 MG tablet Commonly known as: INDERAL Take by mouth.   ranitidine 150 MG tablet Commonly known as: ZANTAC   rosuvastatin 20 MG tablet Commonly known as: CRESTOR Take 20 mg by mouth daily.        Allergies:  Allergies  Allergen Reactions   Orange Oil Swelling   Bean Pod Extract    Corn-Containing Products    Famotidine    Fish Allergy    Grapeseed Extract [Nutritional Supplements]    Sesame Seed [Sesame Oil]    Proanthocyanidin Itching    Family History: Family History  Problem Relation Age of Onset   Prostate cancer Brother    Kidney  cancer Neg Hx    Bladder Cancer Neg Hx     Social History:  reports that he has quit smoking. He has never used smokeless tobacco. He reports that he does not drink alcohol and does not use drugs.   Physical Exam: BP (!) 142/76   Pulse (!) 48   Ht 5\' 7"  (1.702 m)   Wt 160 lb (72.6 kg)   BMI 25.06 kg/m   Constitutional:  Alert and oriented, No acute distress. HEENT: [place] AT Respiratory: Normal respiratory effort, no increased work of breathing. GI: Abdomen is soft, reducible, right inguinal hernia. GU: Moderate right hydrocele, however, the hernia does not appear to extend into the scrotum. Psychiatric: Normal mood and affect.   Assessment & Plan:    1. Right inguinal hernia Discussed the decision to repair hernia is based on degree of bother. Offered a general surgery  referral, however he declined at this time and will call back if symptoms worsen. We discussed signs of potential hernia incarceration and should this occur to proceed to the ED.  2. Right hydrocele  Asymptomatic  I have reviewed the above documentation for accuracy and completeness, and I agree with the above.   Paul Altes, MD  [facility] [address] [place], [place] [address] [phone]

## 2022-08-19 ENCOUNTER — Telehealth: Payer: Self-pay | Admitting: Urology

## 2022-08-19 DIAGNOSIS — K4090 Unilateral inguinal hernia, without obstruction or gangrene, not specified as recurrent: Secondary | ICD-10-CM

## 2022-08-19 NOTE — Telephone Encounter (Signed)
Pt stopped by office to let you know he would like to proceed with referral to general surgery about having inguinal hernia repair.

## 2022-08-20 NOTE — Telephone Encounter (Signed)
Referral was placed and [facility] will contact patient to schedule

## 2022-08-20 NOTE — Telephone Encounter (Signed)
Patient advised.

## 2022-09-01 ENCOUNTER — Encounter: Payer: Self-pay | Admitting: Surgery

## 2022-09-01 ENCOUNTER — Ambulatory Visit: Payer: Medicare PPO | Admitting: Surgery

## 2022-09-01 VITALS — BP 120/71 | HR 54 | Temp 98.0°F | Ht 66.0 in | Wt 162.0 lb

## 2022-09-01 DIAGNOSIS — K4090 Unilateral inguinal hernia, without obstruction or gangrene, not specified as recurrent: Secondary | ICD-10-CM | POA: Insufficient documentation

## 2022-09-01 DIAGNOSIS — N433 Hydrocele, unspecified: Secondary | ICD-10-CM | POA: Diagnosis not present

## 2022-09-01 NOTE — Progress Notes (Signed)
Patient ID: Paul Bongo Sr., male   DOB: [birth year], 77 y.o.   MRN: [id number]  Chief Complaint: Right groin pain  History of Present Illness Paul QUEVEDO Sr. is a 77 y.o. male with right groin pain which began about a month ago.  He reports its minimal, somewhat sporadic.  Has not appreciated much of a bulge.  Is not in any current pain, denies any bowel habit changes, exacerbation with mowing his lawn, or with bowel activity or urinary activity.  Was noted to have right-sided hydrocele by his urologist prior to coming here.  This was felt to be asymptomatic.  Past Medical History Past Medical History:  Diagnosis Date   Diabetes mellitus without complication (HCC)    Enlarged prostate    Hypertension    Parkinson disease    Renal insufficiency       Past Surgical History:  Procedure Laterality Date   PROSTATE SURGERY     TONSILLECTOMY      Allergies  Allergen Reactions   Orange Oil Swelling   Bean Pod Extract    Corn-Containing Products    Famotidine    Fish Allergy    Grapeseed Extract [Nutritional Supplements]    Sesame Seed [Sesame Oil]    Proanthocyanidin Itching    Current Outpatient Medications  Medication Sig Dispense Refill   acetaminophen (TYLENOL) 500 MG tablet Take by mouth.     albuterol (PROVENTIL HFA;VENTOLIN HFA) 108 (90 Base) MCG/ACT inhaler Inhale into the lungs.     allopurinol (ZYLOPRIM) 100 MG tablet Take 100 mg by mouth every morning.     amLODipine (NORVASC) 10 MG tablet Take 1 tablet by mouth daily.     aspirin 81 MG chewable tablet Chew 81 mg by mouth.     atorvastatin (LIPITOR) 40 MG tablet      Baclofen 5 MG TABS Take by mouth.     chlorthalidone (HYGROTON) 25 MG tablet      DULoxetine (CYMBALTA) 30 MG capsule Take 30 mg by mouth daily.     propranolol (INDERAL) 20 MG tablet Take by mouth.     ranitidine (ZANTAC) 150 MG tablet      rosuvastatin (CRESTOR) 20 MG tablet Take 20 mg by mouth daily.     No current facility-administered  medications for this visit.    Family History Family History  Problem Relation Age of Onset   Prostate cancer Brother    Kidney cancer Neg Hx    Bladder Cancer Neg Hx       Social History Social History   Tobacco Use   Smoking status: Former    Passive exposure: Past   Smokeless tobacco: Never  Vaping Use   Vaping Use: Never used  Substance Use Topics   Alcohol use: No   Drug use: No        Review of Systems  Constitutional:  Positive for malaise/fatigue.  HENT:  Positive for hearing loss.   Eyes:  Positive for blurred vision and double vision.  Respiratory:  Positive for wheezing.   Cardiovascular:  Positive for leg swelling.  Gastrointestinal: Negative.   Genitourinary: Negative.   Skin:  Positive for rash.  Neurological:  Positive for speech change.  Psychiatric/Behavioral: Negative.       Physical Exam Blood pressure 120/71, pulse (!) 54, temperature 98 F (36.7 C), height 5\' 6"  (1.676 m), weight 162 lb (73.5 kg), SpO2 100 %. Last Weight  Most recent update: 09/01/2022  9:34 AM    Weight  73.5  kg (162 lb)             CONSTITUTIONAL: Well developed, and nourished, appropriately responsive and aware without distress.   EYES: Sclera non-icteric.   EARS, NOSE, MOUTH AND THROAT:  The oropharynx is clear. Oral mucosa is pink and moist.    Hearing is intact to voice.  NECK: Trachea is midline, and there is no jugular venous distension.  LYMPH NODES:  Lymph nodes in the neck are not appreciated.  There is a 1 to 1.5 cm dermal cyst in the right base of the neck RESPIRATORY:  Lungs are clear, and breath sounds are equal bilaterally.  Normal respiratory effort without pathologic use of accessory muscles. CARDIOVASCULAR: Heart is regular in rate and rhythm.  Well perfused.  GI: The abdomen is  soft, nontender, and nondistended.  There may be a very small umbilical hernia.  There were no other palpable masses.  I did not appreciate hepatosplenomegaly. GU: There is a  right groin bulge that is readily reducible.  There are bilateral hydroceles which transilluminate.  These are nontender. MUSCULOSKELETAL:  Symmetrical muscle tone appreciated in all four extremities.    SKIN: Skin turgor is normal. No pathologic skin lesions appreciated.  NEUROLOGIC:  Motor and sensation appear grossly normal.  Cranial nerves are grossly without defect. PSYCH:  Alert and oriented to person, place and time. Affect is appropriate for situation.  Data Reviewed I have personally reviewed what is currently available of the patient's imaging, recent labs and medical records.   Labs:     Latest Ref Rng & Units 03/30/2022    5:21 PM 07/28/2015    3:52 PM 02/21/2013   12:19 PM  CBC  WBC 4.0 - 10.5 K/uL 8.4  10.3  9.2   Hemoglobin 13.0 - 17.0 g/dL 13.2  44.0  10.2   Hematocrit 39.0 - 52.0 % 32.3  39.3  38.6   Platelets 150 - 400 K/uL 246  208  252       Latest Ref Rng & Units 03/30/2022    5:21 PM 11/11/2018    2:09 PM 02/21/2013   12:19 PM  CMP  Glucose 70 - 99 mg/dL 725   366   BUN 8 - 23 mg/dL 24   17   Creatinine 4.40 - 1.24 mg/dL 3.47  4.25  9.56   Sodium 135 - 145 mmol/L 140   139   Potassium 3.5 - 5.1 mmol/L 4.6   2.9   Chloride 98 - 111 mmol/L 107   103   CO2 22 - 32 mmol/L 27   29   Calcium 8.9 - 10.3 mg/dL 9.1   8.4       Imaging: Radiological images reviewed:   Within last 24 hrs: No results found.  Assessment     Patient Active Problem List   Diagnosis Date Noted   Colon polyps 03/05/2014   H/O multiple allergies 12/01/2013   Erectile dysfunction 02/06/2013   Benign prostatic hyperplasia with lower urinary tract symptoms 02/06/2013   CKD (chronic kidney disease) 06/24/2012   OSA on CPAP 05/30/2012   Primary localized osteoarthrosis, lower leg 10/03/2010   Type II diabetes mellitus (HCC) 06/13/2008   CAD (coronary artery disease) 11/27/2007   Allergic rhinitis 11/02/2002   Gout 01/19/2001   Other specified cardiac arrhythmias 05/25/1989    Essential hypertension 02/23/1899    Plan    We discussed options of elective right inguinal hernia repair, possibly combined with hydrocelectomy.  It is difficult to discern  what is the most symptomatic for him.  Clearly neither are urgent/emergent.  Will give him a months to consider elective procedure versus continuing observation.  Face-to-face time spent with the patient and accompanying care providers(if present) was 40 minutes, with more than 50% of the time spent counseling, educating, and coordinating care of the patient.    These notes generated with voice recognition software. I apologize for typographical errors.  [name] M.D., FACS 09/01/2022, 10:25 AM

## 2022-09-01 NOTE — Patient Instructions (Signed)
Follow up in one month for a recheck. Call us if you would like to get your hernia repaired before this follow up.  Inguinal Hernia, Adult An inguinal hernia is when fat or your intestines push through a weak spot in a muscle where your leg meets your lower belly (groin). This causes a bulge. This kind of hernia could also be: In your scrotum, if you are male. In folds of skin around your vagina, if you are male. There are three types of inguinal hernias: Hernias that can be pushed back into the belly (are reducible). This type rarely causes pain. Hernias that cannot be pushed back into the belly (are incarcerated). Hernias that cannot be pushed back into the belly and lose their blood supply (are strangulated). This type needs emergency surgery. What are the causes? This condition is caused by having a weak spot in the muscles or tissues in your groin. This develops over time. The hernia may poke through the weak spot when you strain your lower belly muscles all of a sudden, such as when you: Lift a heavy object. Strain to poop (have a bowel movement). Trouble pooping (constipation) can lead to straining. Cough. What increases the risk? This condition is more likely to develop in: Males. Pregnant females. People who: Are overweight. Work in jobs that require long periods of standing or heavy lifting. Have had an inguinal hernia before. Smoke or have lung disease. These factors can lead to long-term (chronic) coughing. What are the signs or symptoms? Symptoms may depend on the size of the hernia. Often, a small hernia has no symptoms. Symptoms of a larger hernia may include: A bulge in the groin area. This is easier to see when standing. You might not be able to see it when you are lying down. Pain or burning in the groin. This may get worse when you lift, strain, or cough. A dull ache or a feeling of pressure in the groin. An abnormal bulge in the scrotum, in males. Symptoms of a  strangulated inguinal hernia may include: A bulge in your groin that is very painful and tender to the touch. A bulge that turns red or purple. Fever, feeling like you may vomit (nausea), and vomiting. Not being able to poop or to pass gas. How is this treated? Treatment depends on the size of your hernia and whether you have symptoms. If you do not have symptoms, your doctor may have you watch your hernia carefully and have you come in for follow-up visits. If your hernia is large or if you have symptoms, you may need surgery to repair the hernia. Follow these instructions at home: Lifestyle Avoid lifting heavy objects. Avoid standing for long amounts of time. Do not smoke or use any products that contain nicotine or tobacco. If you need help quitting, ask your doctor. Stay at a healthy weight. Prevent trouble pooping You may need to take these actions to prevent or treat trouble pooping: Drink enough fluid to keep your pee (urine) pale yellow. Take over-the-counter or prescription medicines. Eat foods that are high in fiber. These include beans, whole grains, and fresh fruits and vegetables. Limit foods that are high in fat and sugar. These include fried or sweet foods. General instructions You may try to push your hernia back in place by very gently pressing on it when you are lying down. Do not try to push the bulge back in if it will not go in easily. Watch your hernia for any changes in  shape, size, or color. Tell your doctor if you see any changes. Take over-the-counter and prescription medicines only as told by your doctor. Keep all follow-up visits. Contact a doctor if: You have a fever or chills. You have new symptoms. Your symptoms get worse. Get help right away if: You have pain in your groin that gets worse all of a sudden. You have a bulge in your groin that: Gets bigger all of a sudden, and it does not get smaller after that. Turns red or purple. Is painful when you  touch it. You are a male, and you have: Sudden pain in your scrotum. A sudden change in the size of your scrotum. You cannot push the hernia back in place by very gently pressing on it when you are lying down. You feel like you may vomit, and that feeling does not go away. You keep vomiting. You have a fast heartbeat. You cannot poop or pass gas. These symptoms may be an emergency. Get help right away. Call your local emergency services (911 in the [place] Do not wait to see if the symptoms will go away. Do not drive yourself to the hospital. Summary An inguinal hernia is when fat or your intestines push through a weak spot in a muscle where your leg meets your lower belly (groin). This causes a bulge. If you do not have symptoms, you may not need treatment. If you have symptoms or a large hernia, you may need surgery. Avoid lifting heavy objects. Also, avoid standing for long amounts of time. Do not try to push the bulge back in if it will not go in easily. This information is not intended to replace advice given to you by your health care provider. Make sure you discuss any questions you have with your health care provider. Document Revised: 10/10/2019 Document Reviewed: 10/10/2019 Elsevier Patient Education  2024 [employer].

## 2022-09-01 NOTE — Progress Notes (Signed)
Request for Medical Clearance has been faxed to Dr Mack Guise at [facility] internal medicine.

## 2022-10-06 ENCOUNTER — Ambulatory Visit (INDEPENDENT_AMBULATORY_CARE_PROVIDER_SITE_OTHER): Payer: Medicare PPO | Admitting: Surgery

## 2022-10-06 ENCOUNTER — Encounter: Payer: Self-pay | Admitting: Surgery

## 2022-10-06 VITALS — BP 107/68 | HR 52 | Temp 98.0°F | Ht 66.0 in | Wt 159.0 lb

## 2022-10-06 DIAGNOSIS — K4090 Unilateral inguinal hernia, without obstruction or gangrene, not specified as recurrent: Secondary | ICD-10-CM

## 2022-10-06 NOTE — Progress Notes (Unsigned)
Patient ID: Paul Bongo Sr., male   DOB: 20-Jul-1945, 77 y.o.   MRN: [id number]  Chief Complaint: Right groin pain  History of Present Illness  Currently awaiting medical clearance for proceeding with general anesthesia and robotic right inguinal hernia repair.  Paul Bongo Sr. is a 77 y.o. male with right groin pain which began about a month ago.  He reports its minimal, somewhat sporadic.  Has not appreciated much of a bulge.  Is not in any current pain, denies any bowel habit changes, exacerbation with mowing his lawn, or with bowel activity or urinary activity.  Was noted to have asymptomatic right-sided hydrocele by his urologist prior to coming here.    Past Medical History Past Medical History:  Diagnosis Date   Diabetes mellitus without complication (HCC)    Enlarged prostate    Hypertension    Parkinson disease    Renal insufficiency       Past Surgical History:  Procedure Laterality Date   PROSTATE SURGERY     TONSILLECTOMY      Allergies  Allergen Reactions   Orange Oil Swelling   Bean Pod Extract    Corn-Containing Products    Famotidine    Fish Allergy    Grapeseed Extract [Nutritional Supplements]    Sesame Seed [Sesame Oil]    Proanthocyanidin Itching    Current Outpatient Medications  Medication Sig Dispense Refill   acetaminophen (TYLENOL) 500 MG tablet Take by mouth.     albuterol (PROVENTIL HFA;VENTOLIN HFA) 108 (90 Base) MCG/ACT inhaler Inhale into the lungs.     allopurinol (ZYLOPRIM) 100 MG tablet Take 100 mg by mouth every morning.     amLODipine (NORVASC) 10 MG tablet Take 1 tablet by mouth daily.     aspirin 81 MG chewable tablet Chew 81 mg by mouth.     atorvastatin (LIPITOR) 40 MG tablet      Baclofen 5 MG TABS Take by mouth.     chlorthalidone (HYGROTON) 25 MG tablet      DULoxetine (CYMBALTA) 30 MG capsule Take 30 mg by mouth daily.     propranolol (INDERAL) 20 MG tablet Take by mouth.     ranitidine (ZANTAC) 150 MG tablet       rosuvastatin (CRESTOR) 20 MG tablet Take 20 mg by mouth daily.     No current facility-administered medications for this visit.    Family History Family History  Problem Relation Age of Onset   Prostate cancer Brother    Kidney cancer Neg Hx    Bladder Cancer Neg Hx       Social History Social History   Tobacco Use   Smoking status: Former    Passive exposure: Past   Smokeless tobacco: Never  Vaping Use   Vaping status: Never Used  Substance Use Topics   Alcohol use: No   Drug use: No        Review of Systems  Constitutional:  Positive for malaise/fatigue.  HENT:  Positive for hearing loss.   Eyes:  Positive for blurred vision and double vision.  Respiratory:  Positive for wheezing.   Cardiovascular:  Positive for leg swelling.  Gastrointestinal: Negative.   Genitourinary: Negative.   Skin:  Positive for rash.  Neurological:  Positive for speech change.  Psychiatric/Behavioral: Negative.       Physical Exam Blood pressure 107/68, pulse (!) 52, temperature 98 F (36.7 C), height 5\' 6"  (1.676 m), weight 159 lb (72.1 kg), SpO2 98%. Last Weight  Most  recent update: 10/06/2022  2:23 PM    Weight  72.1 kg (159 lb)             CONSTITUTIONAL: Well developed, and nourished, appropriately responsive and aware without distress.   EYES: Sclera non-icteric.   EARS, NOSE, MOUTH AND THROAT:  The oropharynx is clear. Oral mucosa is pink and moist.    Hearing is intact to voice.  NECK: Trachea is midline, and there is no jugular venous distension.  LYMPH NODES:  Lymph nodes in the neck are not appreciated.  There is a 1 to 1.5 cm dermal cyst in the right base of the neck RESPIRATORY:  Lungs are clear, and breath sounds are equal bilaterally.  Normal respiratory effort without pathologic use of accessory muscles. CARDIOVASCULAR: Heart is regular in rate and rhythm.  Well perfused.  GI: The abdomen is  soft, nontender, and nondistended.  There may be a very small umbilical  hernia.  There were no other palpable masses.  I did not appreciate hepatosplenomegaly. GU: There is a right groin bulge that is readily reducible.  There are bilateral hydroceles which transilluminate.  These are nontender. MUSCULOSKELETAL:  Symmetrical muscle tone appreciated in all four extremities.    SKIN: Skin turgor is normal. No pathologic skin lesions appreciated.  NEUROLOGIC:  Motor and sensation appear grossly normal.  Cranial nerves are grossly without defect. PSYCH:  Alert and oriented to person, place and time. Affect is appropriate for situation.  Data Reviewed I have personally reviewed what is currently available of the patient's imaging, recent labs and medical records.   Labs:     Latest Ref Rng & Units 03/30/2022    5:21 PM 07/28/2015    3:52 PM 02/21/2013   12:19 PM  CBC  WBC 4.0 - 10.5 K/uL 8.4  10.3  9.2   Hemoglobin 13.0 - 17.0 g/dL 95.6  21.3  08.6   Hematocrit 39.0 - 52.0 % 32.3  39.3  38.6   Platelets 150 - 400 K/uL 246  208  252       Latest Ref Rng & Units 03/30/2022    5:21 PM 11/11/2018    2:09 PM 02/21/2013   12:19 PM  CMP  Glucose 70 - 99 mg/dL 578   469   BUN 8 - 23 mg/dL 24   17   Creatinine 6.29 - 1.24 mg/dL 5.28  4.13  2.44   Sodium 135 - 145 mmol/L 140   139   Potassium 3.5 - 5.1 mmol/L 4.6   2.9   Chloride 98 - 111 mmol/L 107   103   CO2 22 - 32 mmol/L 27   29   Calcium 8.9 - 10.3 mg/dL 9.1   8.4       Imaging: Radiological images reviewed:   Within last 24 hrs: No results found.  Assessment    Right inguinal hernia Patient Active Problem List   Diagnosis Date Noted   Hydrocele, bilateral 09/01/2022   Right inguinal hernia 09/01/2022   Colon polyps 03/05/2014   H/O multiple allergies 12/01/2013   Erectile dysfunction 02/06/2013   Benign prostatic hyperplasia with lower urinary tract symptoms 02/06/2013   CKD (chronic kidney disease) 06/24/2012   OSA on CPAP 05/30/2012   Primary localized osteoarthrosis, lower leg 10/03/2010    Type II diabetes mellitus (HCC) 06/13/2008   CAD (coronary artery disease) 11/27/2007   Allergic rhinitis 11/02/2002   Gout 01/19/2001   Other specified cardiac arrhythmias 05/25/1989   Essential hypertension 02/23/1899  Plan    Robotic rght inguinal hernia repair.  Once medical clearance is obtained to proceed with general anesthesia.  I discussed possibility of incarceration, strangulation, enlargement in size over time, and the need for emergency surgery in the face of these.  Also reviewed the techniques of reduction should incarceration occur, and when unsuccessful to present to the ED.  Also discussed that surgery risks include recurrence which can be up to 30% in the case of complex hernias, use of prosthetic materials (mesh) and the increased risk of infection and the possible need for re-operation and removal of mesh, possibility of post-op SBO or ileus, and the risks of general anesthetic including heart attack, stroke, sudden death or some reaction to anesthetic medications. The patient, and those present, appear to understand the risks, any and all questions were answered to the patient's satisfaction.  No guarantees were ever expressed or implied.   Face-to-face time spent with the patient and accompanying care providers(if present) was 40 minutes, with more than 50% of the time spent counseling, educating, and coordinating care  of the patient.    These notes generated with voice recognition software. I apologize for typographical errors.  [name] M.D., FACS 10/06/2022, 2:34 PM

## 2022-10-06 NOTE — H&P (View-Only) (Signed)
 Patient ID: Paul Bongo Sr., male   DOB: 20-Jul-1945, 77 y.o.   MRN: [id number]  Chief Complaint: Right groin pain  History of Present Illness  Currently awaiting medical clearance for proceeding with general anesthesia and robotic right inguinal hernia repair.  Paul Bongo Sr. is a 77 y.o. male with right groin pain which began about a month ago.  He reports its minimal, somewhat sporadic.  Has not appreciated much of a bulge.  Is not in any current pain, denies any bowel habit changes, exacerbation with mowing his lawn, or with bowel activity or urinary activity.  Was noted to have asymptomatic right-sided hydrocele by his urologist prior to coming here.    Past Medical History Past Medical History:  Diagnosis Date   Diabetes mellitus without complication (HCC)    Enlarged prostate    Hypertension    Parkinson disease    Renal insufficiency       Past Surgical History:  Procedure Laterality Date   PROSTATE SURGERY     TONSILLECTOMY      Allergies  Allergen Reactions   Orange Oil Swelling   Bean Pod Extract    Corn-Containing Products    Famotidine    Fish Allergy    Grapeseed Extract [Nutritional Supplements]    Sesame Seed [Sesame Oil]    Proanthocyanidin Itching    Current Outpatient Medications  Medication Sig Dispense Refill   acetaminophen (TYLENOL) 500 MG tablet Take by mouth.     albuterol (PROVENTIL HFA;VENTOLIN HFA) 108 (90 Base) MCG/ACT inhaler Inhale into the lungs.     allopurinol (ZYLOPRIM) 100 MG tablet Take 100 mg by mouth every morning.     amLODipine (NORVASC) 10 MG tablet Take 1 tablet by mouth daily.     aspirin 81 MG chewable tablet Chew 81 mg by mouth.     atorvastatin (LIPITOR) 40 MG tablet      Baclofen 5 MG TABS Take by mouth.     chlorthalidone (HYGROTON) 25 MG tablet      DULoxetine (CYMBALTA) 30 MG capsule Take 30 mg by mouth daily.     propranolol (INDERAL) 20 MG tablet Take by mouth.     ranitidine (ZANTAC) 150 MG tablet       rosuvastatin (CRESTOR) 20 MG tablet Take 20 mg by mouth daily.     No current facility-administered medications for this visit.    Family History Family History  Problem Relation Age of Onset   Prostate cancer Brother    Kidney cancer Neg Hx    Bladder Cancer Neg Hx       Social History Social History   Tobacco Use   Smoking status: Former    Passive exposure: Past   Smokeless tobacco: Never  Vaping Use   Vaping status: Never Used  Substance Use Topics   Alcohol use: No   Drug use: No        Review of Systems  Constitutional:  Positive for malaise/fatigue.  HENT:  Positive for hearing loss.   Eyes:  Positive for blurred vision and double vision.  Respiratory:  Positive for wheezing.   Cardiovascular:  Positive for leg swelling.  Gastrointestinal: Negative.   Genitourinary: Negative.   Skin:  Positive for rash.  Neurological:  Positive for speech change.  Psychiatric/Behavioral: Negative.       Physical Exam Blood pressure 107/68, pulse (!) 52, temperature 98 F (36.7 C), height 5\' 6"  (1.676 m), weight 159 lb (72.1 kg), SpO2 98%. Last Weight  Most  recent update: 10/06/2022  2:23 PM    Weight  72.1 kg (159 lb)             CONSTITUTIONAL: Well developed, and nourished, appropriately responsive and aware without distress.   EYES: Sclera non-icteric.   EARS, NOSE, MOUTH AND THROAT:  The oropharynx is clear. Oral mucosa is pink and moist.    Hearing is intact to voice.  NECK: Trachea is midline, and there is no jugular venous distension.  LYMPH NODES:  Lymph nodes in the neck are not appreciated.  There is a 1 to 1.5 cm dermal cyst in the right base of the neck RESPIRATORY:  Lungs are clear, and breath sounds are equal bilaterally.  Normal respiratory effort without pathologic use of accessory muscles. CARDIOVASCULAR: Heart is regular in rate and rhythm.  Well perfused.  GI: The abdomen is  soft, nontender, and nondistended.  There may be a very small umbilical  hernia.  There were no other palpable masses.  I did not appreciate hepatosplenomegaly. GU: There is a right groin bulge that is readily reducible.  There are bilateral hydroceles which transilluminate.  These are nontender. MUSCULOSKELETAL:  Symmetrical muscle tone appreciated in all four extremities.    SKIN: Skin turgor is normal. No pathologic skin lesions appreciated.  NEUROLOGIC:  Motor and sensation appear grossly normal.  Cranial nerves are grossly without defect. PSYCH:  Alert and oriented to person, place and time. Affect is appropriate for situation.  Data Reviewed I have personally reviewed what is currently available of the patient's imaging, recent labs and medical records.   Labs:     Latest Ref Rng & Units 03/30/2022    5:21 PM 07/28/2015    3:52 PM 02/21/2013   12:19 PM  CBC  WBC 4.0 - 10.5 K/uL 8.4  10.3  9.2   Hemoglobin 13.0 - 17.0 g/dL 95.6  21.3  08.6   Hematocrit 39.0 - 52.0 % 32.3  39.3  38.6   Platelets 150 - 400 K/uL 246  208  252       Latest Ref Rng & Units 03/30/2022    5:21 PM 11/11/2018    2:09 PM 02/21/2013   12:19 PM  CMP  Glucose 70 - 99 mg/dL 578   469   BUN 8 - 23 mg/dL 24   17   Creatinine 6.29 - 1.24 mg/dL 5.28  4.13  2.44   Sodium 135 - 145 mmol/L 140   139   Potassium 3.5 - 5.1 mmol/L 4.6   2.9   Chloride 98 - 111 mmol/L 107   103   CO2 22 - 32 mmol/L 27   29   Calcium 8.9 - 10.3 mg/dL 9.1   8.4       Imaging: Radiological images reviewed:   Within last 24 hrs: No results found.  Assessment    Right inguinal hernia Patient Active Problem List   Diagnosis Date Noted   Hydrocele, bilateral 09/01/2022   Right inguinal hernia 09/01/2022   Colon polyps 03/05/2014   H/O multiple allergies 12/01/2013   Erectile dysfunction 02/06/2013   Benign prostatic hyperplasia with lower urinary tract symptoms 02/06/2013   CKD (chronic kidney disease) 06/24/2012   OSA on CPAP 05/30/2012   Primary localized osteoarthrosis, lower leg 10/03/2010    Type II diabetes mellitus (HCC) 06/13/2008   CAD (coronary artery disease) 11/27/2007   Allergic rhinitis 11/02/2002   Gout 01/19/2001   Other specified cardiac arrhythmias 05/25/1989   Essential hypertension 02/23/1899  Plan    Robotic rght inguinal hernia repair.  Once medical clearance is obtained to proceed with general anesthesia.  I discussed possibility of incarceration, strangulation, enlargement in size over time, and the need for emergency surgery in the face of these.  Also reviewed the techniques of reduction should incarceration occur, and when unsuccessful to present to the ED.  Also discussed that surgery risks include recurrence which can be up to 30% in the case of complex hernias, use of prosthetic materials (mesh) and the increased risk of infection and the possible need for re-operation and removal of mesh, possibility of post-op SBO or ileus, and the risks of general anesthetic including heart attack, stroke, sudden death or some reaction to anesthetic medications. The patient, and those present, appear to understand the risks, any and all questions were answered to the patient's satisfaction.  No guarantees were ever expressed or implied.   Face-to-face time spent with the patient and accompanying care providers(if present) was 40 minutes, with more than 50% of the time spent counseling, educating, and coordinating care  of the patient.    These notes generated with voice recognition software. I apologize for typographical errors.  [name] M.D., FACS 10/06/2022, 2:34 PM

## 2022-10-06 NOTE — Patient Instructions (Signed)
We will resend your request for Clearance to your primary care provider.  You have chose to have your hernia repaired. This will be done by Dr. Claudine Mouton at [facility].  Please see your (blue) Pre-care information that you have been given today. Our surgery scheduler will call you to verify surgery date and to go over information.   You will need to arrange to be out of work for approximately 1-2 weeks and then you may return with a lifting restriction for 4 more weeks. If you have FMLA or Disability paperwork that needs to be filled out, please have your company fax your paperwork to [phone] or you may drop this by either office. This paperwork will be filled out within 3 days after your surgery has been completed.  You may have a bruise in your groin and also swelling and brusing in your testicle area. You may use ice 4-5 times daily for 15-20 minutes each time. Make sure that you place a barrier between you and the ice pack. To decrease the swelling, you may roll up a bath towel and place it vertically in between your thighs with your testicles resting on the towel. You will want to keep this area elevated as much as possible for several days following surgery.    Inguinal Hernia, Adult Muscles help keep everything in the body in its proper place. But if a weak spot in the muscles develops, something can poke through. That is called a hernia. When this happens in the lower part of the belly (abdomen), it is called an inguinal hernia. (It takes its name from a part of the body in this region called the inguinal canal.) A weak spot in the wall of muscles lets some fat or part of the small intestine bulge through. An inguinal hernia can develop at any age. Men get them more often than women. CAUSES  In adults, an inguinal hernia develops over time. It can be triggered by: Suddenly straining the muscles of the lower abdomen. Lifting heavy objects. Straining to have a bowel movement. Difficult  bowel movements (constipation) can lead to this. Constant coughing. This may be caused by smoking or lung disease. Being overweight. Being pregnant. Working at a job that requires long periods of standing or heavy lifting. Having had an inguinal hernia before. One type can be an emergency situation. It is called a strangulated inguinal hernia. It develops if part of the small intestine slips through the weak spot and cannot get back into the abdomen. The blood supply can be cut off. If that happens, part of the intestine may die. This situation requires emergency surgery. SYMPTOMS  Often, a small inguinal hernia has no symptoms. It is found when a healthcare provider does a physical exam. Larger hernias usually have symptoms.  In adults, symptoms may include: A lump in the groin. This is easier to see when the person is standing. It might disappear when lying down. In men, a lump in the scrotum. Pain or burning in the groin. This occurs especially when lifting, straining or coughing. A dull ache or feeling of pressure in the groin. Signs of a strangulated hernia can include: A bulge in the groin that becomes very painful and tender to the touch. A bulge that turns red or purple. Fever, nausea and vomiting. Inability to have a bowel movement or to pass gas. DIAGNOSIS  To decide if you have an inguinal hernia, a healthcare provider will probably do a physical examination. This will include  asking questions about any symptoms you have noticed. The healthcare provider might feel the groin area and ask you to cough. If an inguinal hernia is felt, the healthcare provider may try to slide it back into the abdomen. Usually no other tests are needed. TREATMENT  Treatments can vary. The size of the hernia makes a difference. Options include: Watchful waiting. This is often suggested if the hernia is small and you have had no symptoms. No medical procedure will be done unless symptoms develop. You  will need to watch closely for symptoms. If any occur, contact your healthcare provider right away. Surgery. This is used if the hernia is larger or you have symptoms. Open surgery. This is usually an outpatient procedure (you will not stay overnight in a hospital). An cut (incision) is made through the skin in the groin. The hernia is put back inside the abdomen. The weak area in the muscles is then repaired by herniorrhaphy or hernioplasty. Herniorrhaphy: in this type of surgery, the weak muscles are sewn back together. Hernioplasty: a patch or mesh is used to close the weak area in the abdominal wall. Laparoscopy. In this procedure, a surgeon makes small incisions. A thin tube with a tiny video camera (called a laparoscope) is put into the abdomen. The surgeon repairs the hernia with mesh by looking with the video camera and using two long instruments. HOME CARE INSTRUCTIONS  After surgery to repair an inguinal hernia: You will need to take pain medicine prescribed by your healthcare provider. Follow all directions carefully. You will need to take care of the wound from the incision. Your activity will be restricted for awhile. This will probably include no heavy lifting for several weeks. You also should not do anything too active for a few weeks. When you can return to work will depend on the type of job that you have. During "watchful waiting" periods, you should: Maintain a healthy weight. Eat a diet high in fiber (fruits, vegetables and whole grains). Drink plenty of fluids to avoid constipation. This means drinking enough water and other liquids to keep your urine clear or pale yellow. Do not lift heavy objects. Do not stand for long periods of time. Quit smoking. This should keep you from developing a frequent cough. SEEK MEDICAL CARE IF:  A bulge develops in your groin area. You feel pain, a burning sensation or pressure in the groin. This might be worse if you are lifting or  straining. You develop a fever of more than 100.5 F (38.1 C). SEEK IMMEDIATE MEDICAL CARE IF:  Pain in the groin increases suddenly. A bulge in the groin gets bigger suddenly and does not go down. For men, there is sudden pain in the scrotum. Or, the size of the scrotum increases. A bulge in the groin area becomes red or purple and is painful to touch. You have nausea or vomiting that does not go away. You feel your heart beating much faster than normal. You cannot have a bowel movement or pass gas. You develop a fever of more than 102.0 F (38.9 C).   This information is not intended to replace advice given to you by your health care provider. Make sure you discuss any questions you have with your health care provider.   Document Released: 06/28/2008 Document Revised: 05/04/2011 Document Reviewed: 08/13/2014 Elsevier Interactive Patient Education [employer]

## 2022-10-07 ENCOUNTER — Telehealth: Payer: Self-pay | Admitting: Surgery

## 2022-10-07 NOTE — Telephone Encounter (Signed)
 Patient has been advised of Pre-Admission date/time, and Surgery date at [facility]  Surgery Date: 10/21/22 Preadmission Testing Date: 10/13/22 (phone 8a-1p)  Patient has been made aware to call [phone], between 1-3:00pm the day before surgery, to find out what time to arrive for surgery.

## 2022-10-12 ENCOUNTER — Ambulatory Visit: Payer: Self-pay | Admitting: Surgery

## 2022-10-12 DIAGNOSIS — K4090 Unilateral inguinal hernia, without obstruction or gangrene, not specified as recurrent: Secondary | ICD-10-CM

## 2022-10-13 ENCOUNTER — Other Ambulatory Visit: Payer: Self-pay

## 2022-10-13 ENCOUNTER — Encounter
Admission: RE | Admit: 2022-10-13 | Discharge: 2022-10-13 | Disposition: A | Discharge status: Home / Self Care | Payer: Medicare PPO | Source: Ambulatory Visit | Attending: Surgery | Admitting: Surgery

## 2022-10-13 ENCOUNTER — Encounter: Payer: Self-pay | Admitting: Surgery

## 2022-10-13 DIAGNOSIS — Z01812 Encounter for preprocedural laboratory examination: Secondary | ICD-10-CM

## 2022-10-13 DIAGNOSIS — K4090 Unilateral inguinal hernia, without obstruction or gangrene, not specified as recurrent: Secondary | ICD-10-CM

## 2022-10-13 DIAGNOSIS — Z01818 Encounter for other preprocedural examination: Secondary | ICD-10-CM | POA: Diagnosis not present

## 2022-10-13 HISTORY — DX: Tachycardia, unspecified: R00.0

## 2022-10-13 HISTORY — DX: Localized edema: R60.0

## 2022-10-13 HISTORY — DX: Hyperlipidemia, unspecified: E78.5

## 2022-10-13 HISTORY — DX: Gastro-esophageal reflux disease without esophagitis: K21.9

## 2022-10-13 HISTORY — DX: Gout, unspecified: M10.9

## 2022-10-13 HISTORY — DX: Unilateral inguinal hernia, without obstruction or gangrene, not specified as recurrent: K40.90

## 2022-10-13 HISTORY — DX: Atherosclerotic heart disease of native coronary artery without angina pectoris: I25.10

## 2022-10-13 HISTORY — DX: Sleep apnea, unspecified: G47.30

## 2022-10-13 LAB — CBC WITH DIFFERENTIAL/PLATELET
Abs Immature Granulocytes: 0.06 10*3/uL (ref 0.00–0.07)
Basophils Absolute: 0 10*3/uL (ref 0.0–0.1)
Basophils Relative: 0 %
Eosinophils Absolute: 0.2 10*3/uL (ref 0.0–0.5)
Eosinophils Relative: 2 %
HCT: 34.8 % — ABNORMAL LOW (ref 39.0–52.0)
Hemoglobin: 11.1 g/dL — ABNORMAL LOW (ref 13.0–17.0)
Immature Granulocytes: 1 %
Lymphocytes Relative: 25 %
Lymphs Abs: 2.3 10*3/uL (ref 0.7–4.0)
MCH: 31.5 pg (ref 26.0–34.0)
MCHC: 31.9 g/dL (ref 30.0–36.0)
MCV: 98.9 fL (ref 80.0–100.0)
Monocytes Absolute: 0.7 10*3/uL (ref 0.1–1.0)
Monocytes Relative: 7 %
Neutro Abs: 6.1 10*3/uL (ref 1.7–7.7)
Neutrophils Relative %: 65 %
Platelets: 202 10*3/uL (ref 150–400)
RBC: 3.52 MIL/uL — ABNORMAL LOW (ref 4.22–5.81)
RDW: 13.5 % (ref 11.5–15.5)
WBC: 9.4 10*3/uL (ref 4.0–10.5)
nRBC: 0 % (ref 0.0–0.2)

## 2022-10-13 LAB — COMPREHENSIVE METABOLIC PANEL
ALT: <5 U/L (ref 0–44)
AST: 10 U/L — ABNORMAL LOW (ref 15–41)
Albumin: 3.7 g/dL (ref 3.5–5.0)
Alkaline Phosphatase: 46 U/L (ref 38–126)
Anion gap: 11 (ref 5–15)
BUN: 30 mg/dL — ABNORMAL HIGH (ref 8–23)
CO2: 26 mmol/L (ref 22–32)
Calcium: 9.5 mg/dL (ref 8.9–10.3)
Chloride: 103 mmol/L (ref 98–111)
Creatinine, Ser: 2.17 mg/dL — ABNORMAL HIGH (ref 0.61–1.24)
GFR, Estimated: 30 mL/min — ABNORMAL LOW (ref >60)
Glucose, Bld: 100 mg/dL — ABNORMAL HIGH (ref 70–99)
Potassium: 4.1 mmol/L (ref 3.5–5.1)
Sodium: 140 mmol/L (ref 135–145)
Total Bilirubin: 0.9 mg/dL (ref 0.3–1.2)
Total Protein: 7.1 g/dL (ref 6.5–8.1)

## 2022-10-13 NOTE — Patient Instructions (Addendum)
Your procedure is scheduled on: 10/21/2022  Wednesday  Report to the Registration Desk on the 1st floor of the [facility]. To find out your arrival time, please call [phone] between 1PM - 3PM on: 8/27/ 2024  If your arrival time is 6:00 am, do not arrive before that time as the [facility] entrance doors do not open until 6:00 am.  REMEMBER: Instructions that are not followed completely may result in serious medical risk, up to and including death; or upon the discretion of your surgeon and anesthesiologist your surgery may need to be rescheduled.  Do not eat food after midnight the night before surgery.  No gum chewing or hard candies.  One week prior to surgery: Stop Anti-inflammatories (NSAIDS) such as Advil, Aleve, Ibuprofen, Motrin, Naproxen, Naprosyn and Aspirin based products such as Excedrin, Goody's Powder, BC Powder. Stop ANY OVER THE COUNTER supplements until after surgery. You may however, continue to take Tylenol if needed for pain up until the day of surgery.  Continue taking all prescribed medications with the exception of the following: stop 7 days before surgery.     TAKE ONLY THESE MEDICATIONS THE MORNING OF SURGERY WITH A SIP OF WATER:  amLODipine (NORVASC)  Rosuvastatin (CRESTOR)  Omeprazole (Prilosec) - take one the night before surgery and one the morning of surgery; helps to prevent nausea after surgery. Duloxetine (Cymbalta) Carbidopa-Levodopa  Use albuterol inhaler on the day of surgery and bring to the hospital.  No Alcohol for 24 hours before or after surgery.  No Smoking including e-cigarettes for 24 hours before surgery.  No chewable tobacco products for at least 6 hours before surgery.  No nicotine patches on the day of surgery.  Do not use any "recreational" drugs for at least a week (preferably 2 weeks) before your surgery.  Please be advised that the combination of cocaine and anesthesia may have negative outcomes, up to and including  death. If you test positive for cocaine, your surgery will be cancelled.  On the morning of surgery brush your teeth with toothpaste and water, you may rinse your mouth with mouthwash if you wish. Do not swallow any toothpaste or mouthwash.  Use CHG Soap as directed on instruction sheet.-provided for you   Do not wear jewelry, make-up, hairpins, clips or nail polish.  Do not wear lotions, powders, or perfumes.   Do not shave body hair from the neck down 48 hours before surgery.  Contact lenses, hearing aids and dentures may not be worn into surgery.  Do not bring valuables to the hospital. [facility] is not responsible for any missing/lost belongings or valuables.   Bring your C-PAP to the hospital in case you may have to spend the night.   Notify your doctor if there is any change in your medical condition (cold, fever, infection).  Wear comfortable clothing (specific to your surgery type) to the hospital.  After surgery, you can help prevent lung complications by doing breathing exercises.  Take deep breaths and cough every 1-2 hours. Your doctor may order a device called an Incentive Spirometer to help you take deep breaths. When coughing or sneezing, hold a pillow firmly against your incision with both hands. This is called "splinting." Doing this helps protect your incision. It also decreases belly discomfort.  If you are being discharged the day of surgery, you will not be allowed to drive home. You will need a responsible individual to drive you home and stay with you for 24 hours after surgery.  Please call the Pre-admissions Testing Dept. at [phone] if you have any questions about these instructions.  Surgery Visitation Policy:  Patients having surgery or a procedure may have two visitors.  Children under the age of 71 must have an adult with them who is not the patient.     Preparing for Surgery with CHLORHEXIDINE GLUCONATE (CHG) Soap  Chlorhexidine  Gluconate (CHG) Soap  o An antiseptic cleaner that kills germs and bonds with the skin to continue killing germs even after washing  o Used for showering the night before surgery and morning of surgery  Before surgery, you can play an important role by reducing the number of germs on your skin.  CHG (Chlorhexidine gluconate) soap is an antiseptic cleanser which kills germs and bonds with the skin to continue killing germs even after washing.  Please do not use if you have an allergy to CHG or antibacterial soaps. If your skin becomes reddened/irritated stop using the CHG.  1. Shower the NIGHT BEFORE SURGERY and the MORNING OF SURGERY with CHG soap.  2. If you choose to wash your hair, wash your hair first as usual with your normal shampoo.  3. After shampooing, rinse your hair and body thoroughly to remove the shampoo.  4. Use CHG as you would any other liquid soap. You can apply CHG directly to the skin and wash gently with a scrungie or a clean washcloth.  5. Apply the CHG soap to your body only from the neck down. Do not use on open wounds or open sores. Avoid contact with your eyes, ears, mouth, and genitals (private parts). Wash face and genitals (private parts) with your normal soap.  6. Wash thoroughly, paying special attention to the area where your surgery will be performed.  7. Thoroughly rinse your body with warm water.  8. Do not shower/wash with your normal soap after using and rinsing off the CHG soap.  9. Pat yourself dry with a clean towel.  10. Wear clean pajamas to bed the night before surgery.  12. Place clean sheets on your bed the night of your first shower and do not sleep with pets.  13. Shower again with the CHG soap on the day of surgery prior to arriving at the hospital.  14. Do not apply any deodorants/lotions/powders.  15. Please wear clean clothes to the hospital.  .

## 2022-10-20 ENCOUNTER — Telehealth: Payer: Self-pay

## 2022-10-20 MED ORDER — CHLORHEXIDINE GLUCONATE CLOTH 2 % EX PADS: 6.0000 | MEDICATED_PAD | Freq: Once | CUTANEOUS | Status: DC

## 2022-10-20 MED ORDER — ORAL CARE MOUTH RINSE: 15.0000 mL | Freq: Once | OROMUCOSAL | Status: AC

## 2022-10-20 MED ORDER — BUPIVACAINE LIPOSOME 1.3 % IJ SUSP: 20.0000 mL | Freq: Once | INTRAMUSCULAR | Status: DC

## 2022-10-20 MED ORDER — LACTATED RINGERS IV SOLN: INTRAVENOUS | Status: DC

## 2022-10-20 MED ORDER — GABAPENTIN 300 MG PO CAPS
300.0000 mg | ORAL_CAPSULE | ORAL | Status: AC
  Administered 2022-10-21: 300 mg via ORAL

## 2022-10-20 MED ORDER — CEFAZOLIN SODIUM-DEXTROSE 2-4 GM/100ML-% IV SOLN
2.0000 g | INTRAVENOUS | Status: AC
  Administered 2022-10-21: 2 g via INTRAVENOUS

## 2022-10-20 MED ORDER — CHLORHEXIDINE GLUCONATE 0.12 % MT SOLN
15.0000 mL | Freq: Once | OROMUCOSAL | Status: AC
  Administered 2022-10-21: 15 mL via OROMUCOSAL

## 2022-10-20 MED ORDER — ACETAMINOPHEN 500 MG PO TABS
1000.0000 mg | ORAL_TABLET | ORAL | Status: AC
  Administered 2022-10-21: 1000 mg via ORAL

## 2022-10-20 MED ORDER — CELECOXIB 200 MG PO CAPS: 200.0000 mg | ORAL_CAPSULE | ORAL | Status: DC

## 2022-10-20 NOTE — Telephone Encounter (Signed)
Received medical clearance from Dr. Judie Grieve. Pt's risk assessment is low and is optimized for surgery.

## 2022-10-21 ENCOUNTER — Ambulatory Visit
Admission: RE | Admit: 2022-10-21 | Discharge: 2022-10-21 | Disposition: A | Discharge status: Home / Self Care | Payer: Medicare PPO | Attending: Surgery | Admitting: Surgery

## 2022-10-21 ENCOUNTER — Ambulatory Visit: Payer: Medicare PPO | Admitting: Urgent Care

## 2022-10-21 ENCOUNTER — Encounter
Admission: RE | Disposition: A | Discharge status: Home / Self Care | Payer: Self-pay | Source: Home / Self Care | Attending: Surgery

## 2022-10-21 ENCOUNTER — Other Ambulatory Visit: Payer: Self-pay

## 2022-10-21 ENCOUNTER — Encounter: Payer: Self-pay | Admitting: Surgery

## 2022-10-21 DIAGNOSIS — N189 Chronic kidney disease, unspecified: Secondary | ICD-10-CM | POA: Diagnosis not present

## 2022-10-21 DIAGNOSIS — I129 Hypertensive chronic kidney disease with stage 1 through stage 4 chronic kidney disease, or unspecified chronic kidney disease: Secondary | ICD-10-CM | POA: Insufficient documentation

## 2022-10-21 DIAGNOSIS — M109 Gout, unspecified: Secondary | ICD-10-CM | POA: Insufficient documentation

## 2022-10-21 DIAGNOSIS — K4090 Unilateral inguinal hernia, without obstruction or gangrene, not specified as recurrent: Secondary | ICD-10-CM | POA: Diagnosis not present

## 2022-10-21 DIAGNOSIS — N401 Enlarged prostate with lower urinary tract symptoms: Secondary | ICD-10-CM | POA: Diagnosis not present

## 2022-10-21 DIAGNOSIS — E1122 Type 2 diabetes mellitus with diabetic chronic kidney disease: Secondary | ICD-10-CM | POA: Diagnosis not present

## 2022-10-21 DIAGNOSIS — I2510 Atherosclerotic heart disease of native coronary artery without angina pectoris: Secondary | ICD-10-CM | POA: Insufficient documentation

## 2022-10-21 DIAGNOSIS — G4733 Obstructive sleep apnea (adult) (pediatric): Secondary | ICD-10-CM | POA: Diagnosis not present

## 2022-10-21 DIAGNOSIS — G20A1 Parkinson's disease without dyskinesia, without mention of fluctuations: Secondary | ICD-10-CM | POA: Insufficient documentation

## 2022-10-21 DIAGNOSIS — Z87891 Personal history of nicotine dependence: Secondary | ICD-10-CM | POA: Insufficient documentation

## 2022-10-21 HISTORY — PX: INSERTION OF MESH: SHX5868

## 2022-10-21 HISTORY — DX: Myoneural disorder, unspecified: G70.9

## 2022-10-21 SURGERY — HERNIORRHAPHY, INGUINAL, ROBOT-ASSISTED, LAPAROSCOPIC
Anesthesia: General | Laterality: Right

## 2022-10-21 MED ORDER — ROCURONIUM BROMIDE 100 MG/10ML IV SOLN
INTRAVENOUS | Status: DC | PRN
  Administered 2022-10-21: 50 mg via INTRAVENOUS

## 2022-10-21 MED ORDER — CEFAZOLIN SODIUM-DEXTROSE 2-4 GM/100ML-% IV SOLN
INTRAVENOUS | Status: AC
  Filled 2022-10-21: qty 100

## 2022-10-21 MED ORDER — CELECOXIB 200 MG PO CAPS
ORAL_CAPSULE | ORAL | Status: AC
  Filled 2022-10-21: qty 1

## 2022-10-21 MED ORDER — ROCURONIUM BROMIDE 10 MG/ML (PF) SYRINGE
PREFILLED_SYRINGE | INTRAVENOUS | Status: AC
  Filled 2022-10-21: qty 10

## 2022-10-21 MED ORDER — LIDOCAINE HCL (PF) 2 % IJ SOLN
INTRAMUSCULAR | Status: AC
  Filled 2022-10-21: qty 5

## 2022-10-21 MED ORDER — GABAPENTIN 300 MG PO CAPS
ORAL_CAPSULE | ORAL | Status: AC
  Filled 2022-10-21: qty 1

## 2022-10-21 MED ORDER — SUGAMMADEX SODIUM 200 MG/2ML IV SOLN
INTRAVENOUS | Status: DC | PRN
  Administered 2022-10-21: 200 mg via INTRAVENOUS

## 2022-10-21 MED ORDER — HYDROCODONE-ACETAMINOPHEN 5-325 MG PO TABS: 1.0000 | ORAL_TABLET | Freq: Four times a day (QID) | ORAL | 0 refills | Status: DC | PRN

## 2022-10-21 MED ORDER — ONDANSETRON HCL 4 MG/2ML IJ SOLN
INTRAMUSCULAR | Status: DC | PRN
  Administered 2022-10-21: 4 mg via INTRAVENOUS

## 2022-10-21 MED ORDER — PROPOFOL 10 MG/ML IV BOLUS
INTRAVENOUS | Status: DC | PRN
Start: 2022-10-21 — End: 2022-10-21
  Administered 2022-10-21: 150 mg via INTRAVENOUS
  Administered 2022-10-21: 100 ug/kg/min via INTRAVENOUS

## 2022-10-21 MED ORDER — PROPOFOL 500 MG/50ML IV EMUL
INTRAVENOUS | Status: DC | PRN
  Administered 2022-10-21: 100 ug/kg/min via INTRAVENOUS

## 2022-10-21 MED ORDER — LIDOCAINE HCL (CARDIAC) PF 100 MG/5ML IV SOSY
PREFILLED_SYRINGE | INTRAVENOUS | Status: DC | PRN
  Administered 2022-10-21: 70 mg via INTRAVENOUS

## 2022-10-21 MED ORDER — DEXAMETHASONE SODIUM PHOSPHATE 10 MG/ML IJ SOLN
INTRAMUSCULAR | Status: AC
  Filled 2022-10-21: qty 1

## 2022-10-21 MED ORDER — ONDANSETRON HCL 4 MG/2ML IJ SOLN: 4.0000 mg | Freq: Once | INTRAMUSCULAR | Status: DC | PRN

## 2022-10-21 MED ORDER — CHLORHEXIDINE GLUCONATE 0.12 % MT SOLN
OROMUCOSAL | Status: AC
  Filled 2022-10-21: qty 15

## 2022-10-21 MED ORDER — FENTANYL CITRATE (PF) 100 MCG/2ML IJ SOLN
INTRAMUSCULAR | Status: AC
  Filled 2022-10-21: qty 2

## 2022-10-21 MED ORDER — EPHEDRINE 5 MG/ML INJ
INTRAVENOUS | Status: AC
  Filled 2022-10-21: qty 5

## 2022-10-21 MED ORDER — BUPIVACAINE-EPINEPHRINE (PF) 0.25% -1:200000 IJ SOLN
INTRAMUSCULAR | Status: AC
  Filled 2022-10-21: qty 30

## 2022-10-21 MED ORDER — FENTANYL CITRATE (PF) 100 MCG/2ML IJ SOLN: 25.0000 ug | INTRAMUSCULAR | Status: DC | PRN

## 2022-10-21 MED ORDER — ONDANSETRON HCL 4 MG/2ML IJ SOLN
INTRAMUSCULAR | Status: AC
  Filled 2022-10-21: qty 2

## 2022-10-21 MED ORDER — DEXAMETHASONE SODIUM PHOSPHATE 10 MG/ML IJ SOLN
INTRAMUSCULAR | Status: DC | PRN
  Administered 2022-10-21: 5 mg via INTRAVENOUS

## 2022-10-21 MED ORDER — EPHEDRINE SULFATE (PRESSORS) 50 MG/ML IJ SOLN
INTRAMUSCULAR | Status: DC | PRN
  Administered 2022-10-21 (×2): 10 mg via INTRAVENOUS

## 2022-10-21 MED ORDER — BUPIVACAINE-EPINEPHRINE (PF) 0.25% -1:200000 IJ SOLN
INTRAMUSCULAR | Status: DC | PRN
  Administered 2022-10-21: 16 mL

## 2022-10-21 MED ORDER — PROPOFOL 1000 MG/100ML IV EMUL
INTRAVENOUS | Status: AC
  Filled 2022-10-21: qty 100

## 2022-10-21 MED ORDER — FENTANYL CITRATE (PF) 100 MCG/2ML IJ SOLN
INTRAMUSCULAR | Status: DC | PRN
  Administered 2022-10-21: 50 ug via INTRAVENOUS
  Administered 2022-10-21 (×2): 25 ug via INTRAVENOUS

## 2022-10-21 MED ORDER — ACETAMINOPHEN 500 MG PO TABS
ORAL_TABLET | ORAL | Status: AC
  Filled 2022-10-21: qty 2

## 2022-10-21 SURGICAL SUPPLY — 50 items
ADH SKN CLS APL DERMABOND .7 (GAUZE/BANDAGES/DRESSINGS) ×2
BLADE CLIPPER SURG (BLADE) ×2 IMPLANT
COVER TIP SHEARS 8 DVNC (MISCELLANEOUS) ×2 IMPLANT
COVER WAND RF STERILE (DRAPES) ×2 IMPLANT
DERMABOND ADVANCED .7 DNX12 (GAUZE/BANDAGES/DRESSINGS) ×2 IMPLANT
DRAPE ARM DVNC X/XI (DISPOSABLE) ×6 IMPLANT
DRAPE COLUMN DVNC XI (DISPOSABLE) ×2 IMPLANT
DRAPE UTILITY 15X26 TOWEL STRL (DRAPES) ×2 IMPLANT
ELECT REM PT RETURN 9FT ADLT (ELECTROSURGICAL) ×2
ELECTRODE REM PT RTRN 9FT ADLT (ELECTROSURGICAL) ×2 IMPLANT
FORCEPS BPLR R/ABLATION 8 DVNC (INSTRUMENTS) ×2 IMPLANT
GLOVE ORTHO TXT STRL SZ7.5 (GLOVE) ×6 IMPLANT
GOWN STRL REUS W/ TWL LRG LVL3 (GOWN DISPOSABLE) ×2 IMPLANT
GOWN STRL REUS W/ TWL XL LVL3 (GOWN DISPOSABLE) ×4 IMPLANT
GOWN STRL REUS W/TWL LRG LVL3 (GOWN DISPOSABLE) ×2
GOWN STRL REUS W/TWL XL LVL3 (GOWN DISPOSABLE) ×4
GRASPER SUT TROCAR 14GX15 (MISCELLANEOUS) IMPLANT
IRRIGATION STRYKERFLOW (MISCELLANEOUS) IMPLANT
IRRIGATOR STRYKERFLOW (MISCELLANEOUS)
IV NS 1000ML (IV SOLUTION)
IV NS 1000ML BAXH (IV SOLUTION) IMPLANT
KIT PINK PAD W/HEAD ARE REST (MISCELLANEOUS) ×2
KIT PINK PAD W/HEAD ARM REST (MISCELLANEOUS) ×2 IMPLANT
LABEL OR SOLS (LABEL) ×2 IMPLANT
MANIFOLD NEPTUNE II (INSTRUMENTS) ×2 IMPLANT
MESH 3DMAX LIGHT 4.8X6.7 RT XL (Mesh General) IMPLANT
NDL DRIVE SUT CUT DVNC (INSTRUMENTS) ×2 IMPLANT
NDL HYPO 22X1.5 SAFETY MO (MISCELLANEOUS) ×2 IMPLANT
NDL INSUFFLATION 14GA 120MM (NEEDLE) IMPLANT
NEEDLE DRIVE SUT CUT DVNC (INSTRUMENTS) ×2 IMPLANT
NEEDLE HYPO 22X1.5 SAFETY MO (MISCELLANEOUS) ×2 IMPLANT
NEEDLE INSUFFLATION 14GA 120MM (NEEDLE) IMPLANT
PACK LAP CHOLECYSTECTOMY (MISCELLANEOUS) ×2 IMPLANT
SCISSORS MNPLR CVD DVNC XI (INSTRUMENTS) ×2 IMPLANT
SEAL UNIV 5-12 XI (MISCELLANEOUS) ×6 IMPLANT
SET TUBE SMOKE EVAC HIGH FLOW (TUBING) ×2 IMPLANT
SOL ELECTROSURG ANTI STICK (MISCELLANEOUS) ×2
SOLUTION ELECTROSURG ANTI STCK (MISCELLANEOUS) ×2 IMPLANT
SUT MNCRL 4-0 (SUTURE) ×2
SUT MNCRL 4-0 27XMFL (SUTURE) ×2
SUT V-LOC 90 ABS 3-0 VLT V-20 (SUTURE) IMPLANT
SUT VIC AB 2-0 SH 27 (SUTURE) ×2
SUT VIC AB 2-0 SH 27XBRD (SUTURE) ×2 IMPLANT
SUT VIC AB 3-0 SH 27 (SUTURE)
SUT VIC AB 3-0 SH 27X BRD (SUTURE) IMPLANT
SUT VICRYL 0 UR6 27IN ABS (SUTURE) IMPLANT
SUT VLOC 90 S/L VL9 GS22 (SUTURE) IMPLANT
SUTURE MNCRL 4-0 27XMF (SUTURE) ×2 IMPLANT
TRAP FLUID SMOKE EVACUATOR (MISCELLANEOUS) ×2 IMPLANT
WATER STERILE IRR 500ML POUR (IV SOLUTION) ×2 IMPLANT

## 2022-10-21 NOTE — Anesthesia Procedure Notes (Addendum)
Procedure Name: Intubation Date/Time: 10/21/2022 11:37 AM  Performed by: Lysbeth Penner, CRNAPre-anesthesia Checklist: Patient identified, Emergency Drugs available, Suction available and Patient being monitored Patient Re-evaluated:Patient Re-evaluated prior to induction Oxygen Delivery Method: Circle system utilized Preoxygenation: Pre-oxygenation with 100% oxygen Induction Type: IV induction Ventilation: Mask ventilation without difficulty Laryngoscope Size: McGraph and 3 Grade View: Grade I Tube type: Oral Tube size: 6.5 mm Number of attempts: 1 Airway Equipment and Method: Stylet and Oral airway Placement Confirmation: ETT inserted through vocal cords under direct vision, positive ETCO2 and breath sounds checked- equal and bilateral Secured at: 22 cm Tube secured with: Tape Dental Injury: Teeth and Oropharynx as per pre-operative assessment

## 2022-10-21 NOTE — Interval H&P Note (Signed)
History and Physical Interval Note:  10/21/2022 11:11 AM  Paul Bongo Sr.  has presented today for surgery, with the diagnosis of right inguinal hernia.  The various methods of treatment have been discussed with the patient and family. After consideration of risks, benefits and other options for treatment, the patient has consented to  Procedure(s): XI ROBOTIC ASSISTED INGUINAL HERNIA (Right) as a surgical intervention.  The patient's history has been reviewed, patient examined, no change in status, stable for surgery.  I have reviewed the patient's chart and labs.  Questions were answered to the patient's satisfaction.   The right side is marked.   [name]

## 2022-10-21 NOTE — Op Note (Signed)
Robotic assisted Laparoscopic Transabdominal Right Inguinal Hernia Repair with Mesh       Pre-operative Diagnosis:  Right  Inguinal Hernia   Post-operative Diagnosis: Same   Procedure: Robotic assisted Laparoscopic  repair of Right inguinal hernia(s)   Surgeon: [name], M.D., FACS   Anesthesia: GETA   Findings: Right indirect scrotal inguinal hernia,  evidence of small left sided direct hernia.         Procedure Details  The patient was seen again in the Holding Room. The benefits, complications, treatment options, and expected outcomes were discussed with the patient. The risks of bleeding, infection, recurrence of symptoms, failure to resolve symptoms, recurrence of hernia, ischemic orchitis, chronic pain syndrome or neuroma, were reviewed again. The likelihood of improving the patient's symptoms with return to their baseline status is good.  The patient and/or family concurred with the proposed plan, giving informed consent.  The patient was taken to Operating Room, identified  and the procedure verified as Laparoscopic Inguinal Hernia Repair. Laterality confirmed.  A Time Out was held and the above information confirmed.   Prior to the induction of general anesthesia, antibiotic prophylaxis was administered. VTE prophylaxis was in place. General endotracheal anesthesia was then administered and tolerated well. After the induction, the abdomen was prepped with Chloraprep and draped in the sterile fashion. The patient was positioned in the supine position.   After local infiltration of quarter percent Marcaine with epinephrine, stab incision was made left upper quadrant.  On the left at Palmer's point, the Veress needle is passed with sensation of the layers to penetrate the abdominal wall and into the peritoneum.  Saline drop test is confirmed peritoneal placement.  Insufflation is initiated with carbon dioxide to pressures of 15 mmHg. An 8.5 mm port is placed to the left off of the  midline, with blunt tipped trocar.  Pneumoperitoneum maintained w/o HD changes to pressures of 15 mm Hg with CO2. No evidence of bowel injuries.  Two 8.5 mm ports placed under direct vision in each upper quadrant. The laparoscopy revealed a large deep right indirect defect(s).   The robot was brought ot the table and docked in the standard fashion, no collision between arms was observed. Instruments were kept under direct view at all times. For right inguinal hernia repair,  I developed a peritoneal flap. The sac(s) were reduced and dissected free from adjacent structures. We preserved the vas and the vessels, and visualized them to their convergence and beyond in the retroperitoneum. Once dissection was completed an extra large right sided BARD 3D Light mesh was placed and secured at three points with interrupted 2-0 Vicryl to the pubic tubercle and anteriorly. There was good coverage of the direct, indirect and femoral spaces.  Second look revealed no complications or injuries.   The flap was then closed with 3-0 V-lock suture.  Peritoneal closure without defects.  Once assuring that hemostasis was adequate, all needles/sponges removed, and the robot was undocked.  Under direct visualization I placed the Veress needle into the preperitoneal space the Veress' valve was released allowing extraperitoneal CO2 to escape, it was also used to access the space for supplemental local anesthesia. The ports were removed, the abdomen desulflated.  4-0 subcuticular Monocryl was used at all skin edges. Dermabond was placed.  Patient tolerated the procedure well. There were no complications. He was taken to the recovery room in stable condition.           [name], M.D., FACS 10/21/2022, 12:56 PM

## 2022-10-21 NOTE — Anesthesia Preprocedure Evaluation (Signed)
Anesthesia Evaluation  Patient identified by MRN, date of birth, ID band Patient awake    Reviewed: Allergy & Precautions, H&P , NPO status , Patient's Chart, lab work & pertinent test results, reviewed documented beta blocker date and time   History of Anesthesia Complications Negative for: history of anesthetic complications  Airway Mallampati: II  TM Distance: >3 FB Neck ROM: full    Dental  (+) Dental Advidsory Given, Partial Upper, Missing, Poor Dentition   Pulmonary neg shortness of breath, sleep apnea and Continuous Positive Airway Pressure Ventilation , neg COPD, neg recent URI, former smoker   Pulmonary exam normal breath sounds clear to auscultation       Cardiovascular Exercise Tolerance: Good hypertension, (-) angina + CAD and + Cardiac Stents  (-) Past MI Normal cardiovascular exam+ dysrhythmias + Valvular Problems/Murmurs  Rhythm:regular Rate:Normal     Neuro/Psych neg Seizures  Neuromuscular disease (Parkinson's)  negative psych ROS   GI/Hepatic Neg liver ROS,GERD  ,,  Endo/Other  diabetes    Renal/GU CRFRenal disease  negative genitourinary   Musculoskeletal   Abdominal   Peds  Hematology negative hematology ROS (+)   Anesthesia Other Findings Past Medical History: No date: Bilateral lower extremity edema No date: Coronary artery disease No date: Diabetes mellitus without complication (HCC) No date: Enlarged prostate No date: GERD (gastroesophageal reflux disease) No date: Gout No date: Hyperlipidemia No date: Hypertension No date: Neuromuscular disorder (HCC) No date: Parkinson disease No date: Renal insufficiency No date: Right inguinal hernia No date: Sleep apnea No date: Tachycardia   Reproductive/Obstetrics negative OB ROS                             Anesthesia Physical Anesthesia Plan  ASA: 3  Anesthesia Plan: General   Post-op Pain Management:     Induction: Intravenous  PONV Risk Score and Plan: 2 and Ondansetron, Dexamethasone and Treatment may vary due to age or medical condition  Airway Management Planned: Oral ETT  Additional Equipment:   Intra-op Plan:   Post-operative Plan: Extubation in OR  Informed Consent: I have reviewed the patients History and Physical, chart, labs and discussed the procedure including the risks, benefits and alternatives for the proposed anesthesia with the patient or authorized representative who has indicated his/her understanding and acceptance.     Dental Advisory Given  Plan Discussed with: Anesthesiologist, CRNA and Surgeon  Anesthesia Plan Comments:         Anesthesia Quick Evaluation

## 2022-10-21 NOTE — Discharge Instructions (Signed)

## 2022-10-21 NOTE — Transfer of Care (Signed)
Immediate Anesthesia Transfer of Care Note  Patient: Paul Bongo Sr.  Procedure(s) Performed: XI ROBOTIC ASSISTED INGUINAL HERNIA (Right) INSERTION OF MESH  Patient Location: PACU  Anesthesia Type:General  Level of Consciousness: drowsy, patient cooperative, and responds to stimulation  Airway & Oxygen Therapy: Patient Spontanous Breathing  Post-op Assessment: Report given to RN and Post -op Vital signs reviewed and stable  Post vital signs: Reviewed and stable  Last Vitals:  Vitals Value Taken Time  BP 110/56 10/21/22 1301  Temp 36.3 C 10/21/22 1300  Pulse 62 10/21/22 1306  Resp 13 10/21/22 1306  SpO2 99 % 10/21/22 1306  Vitals shown include unfiled device data.  Last Pain:  Vitals:   10/21/22 1300  TempSrc: Temporal         Complications: There were no known notable events for this encounter.

## 2022-10-22 ENCOUNTER — Encounter: Payer: Self-pay | Admitting: Surgery

## 2022-10-29 NOTE — Anesthesia Postprocedure Evaluation (Signed)
Anesthesia Post Note  Patient: Paul Bongo Sr.  Procedure(s) Performed: XI ROBOTIC ASSISTED INGUINAL HERNIA (Right) INSERTION OF MESH  Patient location during evaluation: PACU Anesthesia Type: General Level of consciousness: awake and alert Pain management: pain level controlled Vital Signs Assessment: post-procedure vital signs reviewed and stable Respiratory status: spontaneous breathing, nonlabored ventilation, respiratory function stable and patient connected to nasal cannula oxygen Cardiovascular status: blood pressure returned to baseline and stable Postop Assessment: no apparent nausea or vomiting Anesthetic complications: no   There were no known notable events for this encounter.   Last Vitals:  Vitals:   10/21/22 1410 10/21/22 1433  BP: 124/86 129/65  Pulse: 68 (!) 57  Resp: 15 15  Temp: (!) 36.2 C (!) 36.1 C  SpO2: 99% 100%    Last Pain:  Vitals:   10/22/22 0906  TempSrc:   PainSc: 4                  Lenard Simmer

## 2022-11-05 ENCOUNTER — Encounter: Payer: Self-pay | Admitting: Physician Assistant

## 2022-11-05 ENCOUNTER — Ambulatory Visit (INDEPENDENT_AMBULATORY_CARE_PROVIDER_SITE_OTHER): Payer: Medicare PPO | Admitting: Physician Assistant

## 2022-11-05 VITALS — BP 143/77 | HR 59 | Temp 98.7°F | Ht 66.0 in | Wt 163.8 lb

## 2022-11-05 DIAGNOSIS — Z09 Encounter for follow-up examination after completed treatment for conditions other than malignant neoplasm: Secondary | ICD-10-CM

## 2022-11-05 DIAGNOSIS — K4090 Unilateral inguinal hernia, without obstruction or gangrene, not specified as recurrent: Secondary | ICD-10-CM

## 2022-11-05 NOTE — Progress Notes (Signed)
[facility] POST-OP OFFICE VISIT  11/05/2022  HPI: Paul LINENBERGER Sr. is a 77 y.o. male 15 days s/p robotic assisted laparoscopic right inguinal hernia repair with Dr Claudine Mouton   He is doing well He reports he had no pain at all post-operatively No fever, chills, nausea, emesis, or bowel changes Incisions are well healed Ambulating well No other complaints   Vital signs: BP (!) 143/77   Pulse (!) 59   Temp 98.7 F (37.1 C) (Oral)   Ht 5\' 6"  (1.676 m)   Wt 163 lb 12.8 oz (74.3 kg)   SpO2 97%   BMI 26.44 kg/m    Physical Exam: Constitutional: Well appearing , NAD Abdomen: Soft, non-tender, non-distended, no rebound/guarding. Groins are soft. Does have bilateral hydrocele Skin: Laparoscopic incisions are healing well, no erythema or drainage   Assessment/Plan: This is a 77 y.o. male 15 days s/p robotic assisted laparoscopic right inguinal hernia repair with Dr Claudine Mouton    - Pain control prn  - Reviewed wound care recommendation  - Reviewed lifting restrictions; 4-6 weeks total  - He can follow up on as needed basis; He understands to call with questions/concerns  -- Paul Oxford, PA-C [facility] 11/05/2022, 2:28 PM M-F: 7am - 4pm

## 2022-11-05 NOTE — Patient Instructions (Signed)

## 2022-12-20 ENCOUNTER — Emergency Department: Payer: Medicare PPO

## 2022-12-20 ENCOUNTER — Emergency Department
Admission: EM | Admit: 2022-12-20 | Discharge: 2022-12-20 | Disposition: A | Discharge status: Home / Self Care | Payer: Medicare PPO | Attending: Emergency Medicine | Admitting: Emergency Medicine

## 2022-12-20 ENCOUNTER — Other Ambulatory Visit: Payer: Self-pay

## 2022-12-20 DIAGNOSIS — I10 Essential (primary) hypertension: Secondary | ICD-10-CM | POA: Insufficient documentation

## 2022-12-20 DIAGNOSIS — E785 Hyperlipidemia, unspecified: Secondary | ICD-10-CM | POA: Insufficient documentation

## 2022-12-20 DIAGNOSIS — L03115 Cellulitis of right lower limb: Secondary | ICD-10-CM | POA: Diagnosis not present

## 2022-12-20 DIAGNOSIS — G20A1 Parkinson's disease without dyskinesia, without mention of fluctuations: Secondary | ICD-10-CM | POA: Diagnosis not present

## 2022-12-20 DIAGNOSIS — I2510 Atherosclerotic heart disease of native coronary artery without angina pectoris: Secondary | ICD-10-CM | POA: Diagnosis not present

## 2022-12-20 DIAGNOSIS — R600 Localized edema: Secondary | ICD-10-CM | POA: Diagnosis present

## 2022-12-20 DIAGNOSIS — E119 Type 2 diabetes mellitus without complications: Secondary | ICD-10-CM | POA: Insufficient documentation

## 2022-12-20 DIAGNOSIS — I700 Atherosclerosis of aorta: Secondary | ICD-10-CM | POA: Diagnosis not present

## 2022-12-20 DIAGNOSIS — L03119 Cellulitis of unspecified part of limb: Secondary | ICD-10-CM

## 2022-12-20 LAB — HEPATIC FUNCTION PANEL
ALT: 6 U/L (ref 0–44)
AST: 10 U/L — ABNORMAL LOW (ref 15–41)
Albumin: 3.5 g/dL (ref 3.5–5.0)
Alkaline Phosphatase: 43 U/L (ref 38–126)
Bilirubin, Direct: <0.1 mg/dL (ref 0.0–0.2)
Total Bilirubin: 0.6 mg/dL (ref 0.3–1.2)
Total Protein: 6.7 g/dL (ref 6.5–8.1)

## 2022-12-20 LAB — CBC
HCT: 32.7 % — ABNORMAL LOW (ref 39.0–52.0)
Hemoglobin: 10.5 g/dL — ABNORMAL LOW (ref 13.0–17.0)
MCH: 31.9 pg (ref 26.0–34.0)
MCHC: 32.1 g/dL (ref 30.0–36.0)
MCV: 99.4 fL (ref 80.0–100.0)
Platelets: 207 10*3/uL (ref 150–400)
RBC: 3.29 MIL/uL — ABNORMAL LOW (ref 4.22–5.81)
RDW: 13.6 % (ref 11.5–15.5)
WBC: 7.7 10*3/uL (ref 4.0–10.5)
nRBC: 0 % (ref 0.0–0.2)

## 2022-12-20 LAB — BASIC METABOLIC PANEL
Anion gap: 8 (ref 5–15)
BUN: 20 mg/dL (ref 8–23)
CO2: 26 mmol/L (ref 22–32)
Calcium: 9.2 mg/dL (ref 8.9–10.3)
Chloride: 108 mmol/L (ref 98–111)
Creatinine, Ser: 1.59 mg/dL — ABNORMAL HIGH (ref 0.61–1.24)
GFR, Estimated: 44 mL/min — ABNORMAL LOW (ref >60)
Glucose, Bld: 103 mg/dL — ABNORMAL HIGH (ref 70–99)
Potassium: 4.1 mmol/L (ref 3.5–5.1)
Sodium: 142 mmol/L (ref 135–145)

## 2022-12-20 LAB — URINALYSIS, ROUTINE W REFLEX MICROSCOPIC
Bilirubin Urine: NEGATIVE
Glucose, UA: NEGATIVE mg/dL
Hgb urine dipstick: NEGATIVE
Ketones, ur: NEGATIVE mg/dL
Leukocytes,Ua: NEGATIVE
Nitrite: NEGATIVE
Protein, ur: NEGATIVE mg/dL
Specific Gravity, Urine: 1.015 (ref 1.005–1.030)
pH: 6 (ref 5.0–8.0)

## 2022-12-20 LAB — BRAIN NATRIURETIC PEPTIDE: B Natriuretic Peptide: 98.2 pg/mL (ref 0.0–100.0)

## 2022-12-20 LAB — TROPONIN I (HIGH SENSITIVITY): Troponin I (High Sensitivity): 8 ng/L (ref <18)

## 2022-12-20 MED ORDER — CEPHALEXIN 500 MG PO CAPS
500.0000 mg | ORAL_CAPSULE | Freq: Once | ORAL | Status: AC
  Administered 2022-12-20: 500 mg via ORAL
  Filled 2022-12-20: qty 1

## 2022-12-20 MED ORDER — CEPHALEXIN 500 MG PO CAPS: 500.0000 mg | ORAL_CAPSULE | Freq: Two times a day (BID) | ORAL | 0 refills | Status: DC

## 2022-12-20 NOTE — ED Triage Notes (Signed)
Pt comes via POV from home with c/o leg swelling bilaterally for awhile but today he noticed it was worse. Pt states tightness in legs. Pt does have pitting edema present.   Pt denies any sob.

## 2022-12-20 NOTE — ED Provider Notes (Signed)
[facility] Provider Note    Event Date/Time   First MD Initiated Contact with Patient 12/20/22 2019     (approximate)  History   Chief Complaint: Leg Swelling  HPI  Paul BALASH Sr. is a 77 y.o. male with a past medical history of peripheral edema, diabetes, gastric reflux, hypertension, hyperlipidemia, Parkinson's, presents to the emergency department for increased lower extremity edema.  According to the patient he noted today that his legs looked more swollen than typical.  Patient does admit that he typically does have lower extremity IMA but has been increased today.  Patient denies any shortness of breath or chest pain.  Patient does have some mild erythema of the right lower extremity surrounding a scab but denies any pain.  Patient states he is prescribed compression stockings but has not been wearing them.  Patient states he was on fluid pills but was taken off of them by his kidney doctor.  Physical Exam   Triage Vital Signs: ED Triage Vitals  Encounter Vitals Group     BP 12/20/22 1806 (!) 154/74     Systolic BP Percentile --      Diastolic BP Percentile --      Pulse Rate 12/20/22 1806 (!) 58     Resp 12/20/22 1806 18     Temp 12/20/22 1806 98 F (36.7 C)     Temp src --      SpO2 12/20/22 1806 98 %     Weight --      Height --      Head Circumference --      Peak Flow --      Pain Score 12/20/22 1807 0     Pain Loc --      Pain Education --      Exclude from Growth Chart --     Most recent vital signs: Vitals:   12/20/22 1806  BP: (!) 154/74  Pulse: (!) 58  Resp: 18  Temp: 98 F (36.7 C)  SpO2: 98%    General: Awake, no distress.  CV:  Good peripheral perfusion.  Regular rate and rhythm  Resp:  Normal effort.  Equal breath sounds bilaterally.  Abd:  No distention.  Soft, nontender.  No rebound or guarding. Other:  Patient has 1-2+ pitting edema bilaterally right slightly greater than left.  There is also some mild  erythema in the right lower extremity especially surrounding a scabbed area.   ED Results / Procedures / Treatments   EKG  EKG viewed and interpreted by myself shows sinus bradycardia 59 bpm the narrow QRS, normal axis, normal intervals, no concerning ST changes.  RADIOLOGY  I reviewed and interpreted chest x-ray images.  No consolidation seen my evaluation. Radiology is read the x-ray is negative for acute process however there is a 1.6 admit of right upper lobe nodule.  I discussed this with the patient he was unaware of this nodule previously.   MEDICATIONS ORDERED IN ED: Medications - No data to display   IMPRESSION / MDM / ASSESSMENT AND PLAN / ED COURSE  I reviewed the triage vital signs and the nursing notes.  Patient's presentation is most consistent with acute presentation with potential threat to life or bodily function.  Patient presents to the emergency department for increased lower extreme edema.  Patient states he always keeps fluid in his legs but states today he noted that there was bigger especially in the right leg.  Patient denies any pain.  On examination he has 1-2+ pitting edema bilaterally right slightly greater than left with mild erythema of the right lower extremity possibly indicating mild cellulitis.  Patient's lab work is reassuring, with a normal CBC with a normal white blood cell count, reassuring chemistry with chronic renal insufficiency but unchanged from historical values.  Patient's BNP reassuringly is normal and troponin is negative.  We will obtain a CT scan of the chest to further evaluate the pulmonary nodule we will also obtain ultrasounds of the bilateral lower extremities to rule out DVT.  As long as imaging does not show any significant findings anticipate likely discharge home.  I discussed with the patient the importance of using knee-high compression stockings once again.  Given the mild erythema the right lower extremity we will cover with  antibiotic for likely mild cellulitis which could be contributing to the edema.  I discussed with the patient he needs to follow-up with his kidney doctor about potentially starting a fluid pill/diuretic once again but I do not believe it is urgent or emergent at this time.  Patient's workup is reassuring, CBC and chemistry showed no significant finding.  BNP is normal troponin normal.  LFTs normal.  CT scan of the chest shows no concerning nodule.  Ultrasound of the legs are negative for DVT.  Given the patient's reassuring workup we will cover with an antibiotic as a precaution given the patient's mild erythema of the right lower extremity.  Will have the patient follow-up with his doctor as well as his nephrologist to discuss possible diuretics once again.  Discussed with the patient use of knee-high compression stockings which patient states he has at home.  FINAL CLINICAL IMPRESSION(S) / ED DIAGNOSES   Peripheral edema Cellulitis  Rx / DC Orders   Keflex  Note:  This document was prepared using Dragon voice recognition software and may include unintentional dictation errors.   Minna Antis, MD 12/20/22 2221

## 2023-01-11 ENCOUNTER — Emergency Department
Admission: EM | Admit: 2023-01-11 | Discharge: 2023-01-11 | Disposition: A | Discharge status: Home / Self Care | Payer: Medicare PPO | Attending: Emergency Medicine | Admitting: Emergency Medicine

## 2023-01-11 ENCOUNTER — Emergency Department: Payer: Medicare PPO

## 2023-01-11 ENCOUNTER — Other Ambulatory Visit: Payer: Self-pay

## 2023-01-11 DIAGNOSIS — M109 Gout, unspecified: Secondary | ICD-10-CM | POA: Insufficient documentation

## 2023-01-11 DIAGNOSIS — M25562 Pain in left knee: Secondary | ICD-10-CM | POA: Diagnosis present

## 2023-01-11 LAB — COMPREHENSIVE METABOLIC PANEL
ALT: <5 U/L (ref 0–44)
AST: 10 U/L — ABNORMAL LOW (ref 15–41)
Albumin: 3.5 g/dL (ref 3.5–5.0)
Alkaline Phosphatase: 45 U/L (ref 38–126)
Anion gap: 7 (ref 5–15)
BUN: 27 mg/dL — ABNORMAL HIGH (ref 8–23)
CO2: 27 mmol/L (ref 22–32)
Calcium: 8.9 mg/dL (ref 8.9–10.3)
Chloride: 106 mmol/L (ref 98–111)
Creatinine, Ser: 1.64 mg/dL — ABNORMAL HIGH (ref 0.61–1.24)
GFR, Estimated: 42 mL/min — ABNORMAL LOW (ref >60)
Glucose, Bld: 119 mg/dL — ABNORMAL HIGH (ref 70–99)
Potassium: 4.3 mmol/L (ref 3.5–5.1)
Sodium: 140 mmol/L (ref 135–145)
Total Bilirubin: 0.5 mg/dL (ref <1.2)
Total Protein: 6.7 g/dL (ref 6.5–8.1)

## 2023-01-11 LAB — SYNOVIAL CELL COUNT + DIFF, W/ CRYSTALS
Eosinophils-Synovial: 0 %
Lymphocytes-Synovial Fld: 1 %
Monocyte-Macrophage-Synovial Fluid: 19 %
Neutrophil, Synovial: 80 %
WBC, Synovial: 624 /mm3 — ABNORMAL HIGH (ref 0–200)

## 2023-01-11 LAB — CBC
HCT: 34.3 % — ABNORMAL LOW (ref 39.0–52.0)
Hemoglobin: 10.9 g/dL — ABNORMAL LOW (ref 13.0–17.0)
MCH: 31.4 pg (ref 26.0–34.0)
MCHC: 31.8 g/dL (ref 30.0–36.0)
MCV: 98.8 fL (ref 80.0–100.0)
Platelets: 191 10*3/uL (ref 150–400)
RBC: 3.47 MIL/uL — ABNORMAL LOW (ref 4.22–5.81)
RDW: 13.6 % (ref 11.5–15.5)
WBC: 12.8 10*3/uL — ABNORMAL HIGH (ref 4.0–10.5)
nRBC: 0 % (ref 0.0–0.2)

## 2023-01-11 LAB — URIC ACID: Uric Acid, Serum: 6.8 mg/dL (ref 3.7–8.6)

## 2023-01-11 MED ORDER — PREDNISONE 20 MG PO TABS
40.0000 mg | ORAL_TABLET | Freq: Once | ORAL | Status: AC
  Administered 2023-01-11: 40 mg via ORAL
  Filled 2023-01-11: qty 2

## 2023-01-11 MED ORDER — COLCHICINE 0.6 MG PO TABS
0.6000 mg | ORAL_TABLET | Freq: Once | ORAL | Status: AC
  Administered 2023-01-11: 0.6 mg via ORAL
  Filled 2023-01-11: qty 1

## 2023-01-11 MED ORDER — PREDNISONE 10 MG PO TABS: 40.0000 mg | ORAL_TABLET | Freq: Every day | ORAL | 0 refills | Status: AC

## 2023-01-11 MED ORDER — LIDOCAINE HCL (PF) 1 % IJ SOLN
5.0000 mL | Freq: Once | INTRAMUSCULAR | Status: AC
  Administered 2023-01-11: 5 mL via INTRADERMAL
  Filled 2023-01-11: qty 5

## 2023-01-11 NOTE — Discharge Instructions (Signed)
You can continue taking Tylenol at home to help with your pain symptoms.  I have sent a steroid that you will take for 4 days starting tomorrow that will also help reduce inflammation.  Please follow-up with your primary care provider in the next couple days for reassessment and pain management.

## 2023-01-11 NOTE — ED Provider Notes (Signed)
[facility] Provider Note    Event Date/Time   First MD Initiated Contact with Patient 01/11/23 0945     (approximate)   History   Leg Pain   HPI Paul Quinn Sr. is a 77 y.o. male with prior history of gout presenting today for left knee pain.  Patient states he has had worsening left knee pain over the past 3 days.  Now only able to ambulate with a walker.  Notices pain anytime walking as well as any attempt with bending his left knee.  Denies any obvious swelling or redness to the knee.  Denies any trauma.  No fevers, chills, shortness of breath.  Not currently on any medications for gout.  Reviewed most recent chart notes.     Physical Exam   Triage Vital Signs: ED Triage Vitals  Encounter Vitals Group     BP 01/11/23 0939 126/64     Systolic BP Percentile --      Diastolic BP Percentile --      Pulse Rate 01/11/23 0939 60     Resp 01/11/23 0939 17     Temp 01/11/23 0939 98.8 F (37.1 C)     Temp Source 01/11/23 0939 Oral     SpO2 01/11/23 0939 97 %     Weight 01/11/23 0940 163 lb 12.8 oz (74.3 kg)     Height 01/11/23 0940 5\' 6"  (1.676 m)     Head Circumference --      Peak Flow --      Pain Score 01/11/23 0940 8     Pain Loc --      Pain Education --      Exclude from Growth Chart --     Most recent vital signs: Vitals:   01/11/23 0939  BP: 126/64  Pulse: 60  Resp: 17  Temp: 98.8 F (37.1 C)  SpO2: 97%   I have reviewed the vital signs. General:  Awake, alert, no acute distress. Head:  Normocephalic, Atraumatic. EENT:  PERRL, EOMI, Oral mucosa pink and moist, Neck is supple. Cardiovascular: Regular rate, 2+ distal pulses. Respiratory:  Normal respiratory effort, symmetrical expansion, no distress.   Extremities: Mild pain with range of motion to left knee.  No obvious unilateral edema when compared to the right knee.  No erythema or warmth. Neuro:  Alert and oriented.  Interacting appropriately.   Skin:  Warm, dry, no  rash.   Psych: Appropriate affect.     ED Results / Procedures / Treatments   Labs (all labs ordered are listed, but only abnormal results are displayed) Labs Reviewed  CBC - Abnormal; Notable for the following components:      Result Value   WBC 12.8 (*)    RBC 3.47 (*)    Hemoglobin 10.9 (*)    HCT 34.3 (*)    All other components within normal limits  COMPREHENSIVE METABOLIC PANEL - Abnormal; Notable for the following components:   Glucose, Bld 119 (*)    BUN 27 (*)    Creatinine, Ser 1.64 (*)    AST 10 (*)    GFR, Estimated 42 (*)    All other components within normal limits  SYNOVIAL CELL COUNT + DIFF, W/ CRYSTALS - Abnormal; Notable for the following components:   Appearance-Synovial CLEAR (*)    WBC, Synovial 624 (*)    All other components within normal limits  BODY FLUID CULTURE W GRAM STAIN  URIC ACID  GLUCOSE, BODY FLUID OTHER  PROTEIN, BODY FLUID (OTHER)     EKG    RADIOLOGY Independently interpreted x-ray with no acute pathology   PROCEDURES:  Critical Care performed: No  .Joint Aspiration/Arthrocentesis  Date/Time: 01/11/2023 1:35 PM  Performed by: Janith Lima, MD Authorized by: Janith Lima, MD   Consent:    Consent obtained:  Verbal   Consent given by:  Patient   Risks, benefits, and alternatives were discussed: yes     Risks discussed:  Bleeding, infection, pain and incomplete drainage   Alternatives discussed:  No treatment Universal protocol:    Patient identity confirmed:  Verbally with patient Location:    Location:  Knee   Knee:  L knee Anesthesia:    Anesthesia method:  Local infiltration   Local anesthetic:  Lidocaine 1% w/o epi Procedure details:    Preparation: Patient was prepped and draped in usual sterile fashion     Needle gauge:  18 G   Ultrasound guidance: no     Approach:  Lateral   Aspirate amount:  25cc   Aspirate characteristics:  Clear and yellow   Steroid injected: no     Specimen  collected: yes   Post-procedure details:    Dressing:  Sterile dressing   Procedure completion:  Tolerated well, no immediate complications    MEDICATIONS ORDERED IN ED: Medications  colchicine tablet 0.6 mg (has no administration in time range)  predniSONE (DELTASONE) tablet 40 mg (has no administration in time range)  lidocaine (PF) (XYLOCAINE) 1 % injection 5 mL (5 mLs Intradermal Given by Other 01/11/23 1213)     IMPRESSION / MDM / ASSESSMENT AND PLAN / ED COURSE  I reviewed the triage vital signs and the nursing notes.                              Differential diagnosis includes, but is not limited to, gout, reactive arthritis, septic arthritis  Patient's presentation is most consistent with acute complicated illness / injury requiring diagnostic workup.  Patient is a 77 year old male presenting today for left knee pain.  Prior history of gout concern for the same.  Given severe pain with range of motion, will perform arthrocentesis to rule out septic joint.  Vital signs otherwise stable.  Laboratory workup only notable for mild leukocytosis at 12.8 K.  X-ray of left knee shows arthritis with moderate effusion but no other acute findings.  Arthrocentesis was performed with 25 cc removal of fluid.  Cell count shows minimal WBCs and positive for monosodium urate crystals indicative of gout as the source of his knee pain.  Will give 1 dose of colchicine here but will not prescribe any NSAIDs or additional colchicine given his CKD.  Will start on steroids and have him follow-up with PCP.  Was given strict return precautions for worsening symptoms.  The patient is on the cardiac monitor to evaluate for evidence of arrhythmia and/or significant heart rate changes. Clinical Course as of 01/11/23 1335  Mon Jan 11, 2023  1019 Comprehensive metabolic panel(!) Comparable to known CKD [DW]  1159 25cc removed from arthrocentesis [DW]  1319 Synovial cell count + diff, w/ crystals(!) Crystals  in the knee with low WBC.  Gout most likely source of patient's knee pain.  Will discharge on medication [DW]    Clinical Course User Index [DW] Janith Lima, MD     FINAL CLINICAL IMPRESSION(S) / ED DIAGNOSES   Final diagnoses:  Acute gout  of left knee, unspecified cause     Rx / DC Orders   ED Discharge Orders          Ordered    predniSONE (DELTASONE) 10 MG tablet  Daily        01/11/23 1334             Note:  This document was prepared using Dragon voice recognition software and may include unintentional dictation errors.   Janith Lima, MD 01/11/23 [phone]

## 2023-01-11 NOTE — ED Triage Notes (Signed)
Pt here with left leg pain x3 days. Pt denies fall or injury. Pt states is is very painful if he stands on that leg. Pt states he has a hx of gout but is not on any medication. Pt a&o x4 in triage.

## 2023-01-12 LAB — GLUCOSE, BODY FLUID OTHER: Glucose, Body Fluid Other: 103 mg/dL

## 2023-01-12 LAB — PROTEIN, BODY FLUID (OTHER): Total Protein, Body Fluid Other: 3 g/dL

## 2023-01-14 LAB — BODY FLUID CULTURE W GRAM STAIN: Culture: NO GROWTH

## 2023-04-04 ENCOUNTER — Emergency Department
Admission: EM | Admit: 2023-04-04 | Discharge: 2023-04-04 | Disposition: A | Discharge status: Home / Self Care | Payer: Medicare PPO | Attending: Emergency Medicine | Admitting: Emergency Medicine

## 2023-04-04 ENCOUNTER — Other Ambulatory Visit: Payer: Self-pay

## 2023-04-04 DIAGNOSIS — M79672 Pain in left foot: Secondary | ICD-10-CM | POA: Diagnosis not present

## 2023-04-04 DIAGNOSIS — R600 Localized edema: Secondary | ICD-10-CM | POA: Diagnosis not present

## 2023-04-04 DIAGNOSIS — M79671 Pain in right foot: Secondary | ICD-10-CM | POA: Insufficient documentation

## 2023-04-04 MED ORDER — PREDNISONE 50 MG PO TABS
50.0000 mg | ORAL_TABLET | Freq: Every day | ORAL | 0 refills | Status: AC
Start: 2023-04-04 — End: 2023-04-08

## 2023-04-04 NOTE — ED Provider Notes (Signed)
 [facility] Provider Note   Event Date/Time   First MD Initiated Contact with Patient 04/04/23 [id number]     (approximate) History  Foot Pain and Foot Swelling  HPI Paul RORIE Sr. is a 78 y.o. male with a past medical history of gout and chronic bilateral lower extremity edema who presents complaining of worsening bilateral lower extremity edema in both feet as well as significant pain over the left lateral malleolus and right medial MCP joint on the first toe.  Patient states that these are both areas that he has had gout flares before and this feels similar.  Patient also complaining of worsening edema in bilateral lower extremities especially after he has not been using his compression stockings. ROS: Patient currently denies any vision changes, tinnitus, difficulty speaking, facial droop, sore throat, chest pain, shortness of breath, dyspnea on exertion, abdominal pain, nausea/vomiting/diarrhea, dysuria, or weakness/numbness/paresthesias in any extremity   Physical Exam  Triage Vital Signs: ED Triage Vitals  Encounter Vitals Group     BP 04/04/23 0944 93/60     Systolic BP Percentile --      Diastolic BP Percentile --      Pulse Rate 04/04/23 0944 69     Resp 04/04/23 0944 18     Temp 04/04/23 0944 98.6 F (37 C)     Temp Source 04/04/23 0944 Oral     SpO2 04/04/23 0944 99 %     Weight 04/04/23 0945 163 lb (73.9 kg)     Height 04/04/23 0945 5' 6 (1.676 m)     Head Circumference --      Peak Flow --      Pain Score 04/04/23 0944 8     Pain Loc --      Pain Education --      Exclude from Growth Chart --    Most recent vital signs: Vitals:   04/04/23 1030 04/04/23 1100  BP: 133/63 133/61  Pulse: (!) 57 (!) 51  Resp:    Temp:    SpO2: 100% 100%   General: Awake, oriented x4. CV:  Good peripheral perfusion.  Resp:  Normal effort.  Abd:  No distention.  Other:  Elderly overweight African-American male resting comfortably in no acute distress.   There is erythematous tophi to the right medial MTP joint on the right foot ED Results / Procedures / Treatments  Labs (all labs ordered are listed, but only abnormal results are displayed) Labs Reviewed - No data to display PROCEDURES: Critical Care performed: No Procedures MEDICATIONS ORDERED IN ED: Medications - No data to display IMPRESSION / MDM / ASSESSMENT AND PLAN / ED COURSE  I reviewed the triage vital signs and the nursing notes.                             The patient is on the cardiac monitor to evaluate for evidence of arrhythmia and/or significant heart rate changes. Patient's presentation is most consistent with acute presentation with potential threat to life or bodily function. Bilateral lower extremity pain with left medial malleolus pain and right first toe joint pain Given history, exam and workup I have low suspicion for fracture, dislocation, significant ligamentous injury, septic arthritis, new autoimmune arthropathy, or gonococcal arthropathy.  Patient has signs and symptoms consistent with previous gout flares and will be treated with prednisone  for empiric gout flare at this time.  Patient was encouraged to increase his diuretic medicines  at home as well as decrease his fluid intake and follow-up with his PCP for further management of these disorders. Disposition: Discharge home with strict return precautions and instructions for prompt primary care follow up in the next week.   FINAL CLINICAL IMPRESSION(S) / ED DIAGNOSES   Final diagnoses:  Bilateral foot pain  Bilateral lower extremity edema   Rx / DC Orders   ED Discharge Orders          Ordered    predniSONE  (DELTASONE ) 50 MG tablet  Daily with breakfast        04/04/23 1015           Note:  This document was prepared using Dragon voice recognition software and may include unintentional dictation errors.   Jossie Artist POUR, MD 04/04/23 1324

## 2023-04-04 NOTE — Discharge Instructions (Addendum)
 Please take a double dose of your fluid pill for the next 4 days without increasing how much fluids you're drinking. If your foot pain improves with the fluid loss alone, please stop your prednisone  fore a potential gout flare

## 2023-04-04 NOTE — ED Triage Notes (Addendum)
 Pt c/o increasing pain and swelling in bilateral feet x1 week.  Pain score 8/10.  Hx of gout.    Pt's BP was soft at 93/60.  Pt has taken his HTN medication this morning.  Denies lightheadedness and dizziness.

## 2023-04-11 ENCOUNTER — Emergency Department
Admission: EM | Admit: 2023-04-11 | Discharge: 2023-04-11 | Disposition: A | Discharge status: Home / Self Care | Payer: Medicare PPO | Attending: Emergency Medicine | Admitting: Emergency Medicine

## 2023-04-11 ENCOUNTER — Encounter: Payer: Self-pay | Admitting: Emergency Medicine

## 2023-04-11 ENCOUNTER — Other Ambulatory Visit: Payer: Self-pay

## 2023-04-11 DIAGNOSIS — I2510 Atherosclerotic heart disease of native coronary artery without angina pectoris: Secondary | ICD-10-CM | POA: Diagnosis not present

## 2023-04-11 DIAGNOSIS — M79671 Pain in right foot: Secondary | ICD-10-CM | POA: Diagnosis present

## 2023-04-11 DIAGNOSIS — M109 Gout, unspecified: Secondary | ICD-10-CM | POA: Insufficient documentation

## 2023-04-11 DIAGNOSIS — I129 Hypertensive chronic kidney disease with stage 1 through stage 4 chronic kidney disease, or unspecified chronic kidney disease: Secondary | ICD-10-CM | POA: Diagnosis not present

## 2023-04-11 DIAGNOSIS — N189 Chronic kidney disease, unspecified: Secondary | ICD-10-CM | POA: Diagnosis not present

## 2023-04-11 MED ORDER — HYDROCODONE-ACETAMINOPHEN 5-325 MG PO TABS: 1.0000 | ORAL_TABLET | Freq: Two times a day (BID) | ORAL | 0 refills | Status: AC | PRN

## 2023-04-11 NOTE — ED Triage Notes (Signed)
Patient to ED via POV for right foot pain. HX of gout. Seen for same on 04/04/23. Denies any injury.

## 2023-04-11 NOTE — Discharge Instructions (Addendum)
Take the pain medicine up to 2 times a day as needed. Take the Extra Strength Tylenol for non-drowsy pain relief. Follow-up with your primary provider as needed.

## 2023-04-11 NOTE — ED Provider Notes (Signed)
[facility] Emergency Department Provider Note     Event Date/Time   First MD Initiated Contact with Patient 04/11/23 1855     (approximate)   History   Foot Pain   HPI  Paul ILLES Sr. is a 78 y.o. male with a history of gout, CKD, CAD, HTN, presents to the ED for right foot pain.  Patient seen and evaluated for the same complaint tonight.  He was given a discharge prescription for prednisone which she took as prescribed.  By his report, his symptoms returned once he completed a course of steroids.  Patient denies any recent injury, trauma, or falls.  He localizes the pain to the right great toe.  Physical Exam   Triage Vital Signs: ED Triage Vitals [04/11/23 1511]  Encounter Vitals Group     BP (!) 101/59     Systolic BP Percentile      Diastolic BP Percentile      Pulse Rate 66     Resp 18     Temp 98.6 F (37 C)     Temp Source Oral     SpO2 97 %     Weight 160 lb (72.6 kg)     Height 5\' 7"  (1.702 m)     Head Circumference      Peak Flow      Pain Score 10     Pain Loc      Pain Education      Exclude from Growth Chart     Most recent vital signs: Vitals:   04/11/23 1511 04/11/23 2047  BP: (!) 101/59 138/71  Pulse: 66 (!) 54  Resp: 18 18  Temp: 98.6 F (37 C) 97.7 F (36.5 C)  SpO2: 97% 97%    General Awake, no distress. NAD HEENT NCAT. PERRL. EOMI. No rhinorrhea. Mucous membranes are moist.  CV:  Good peripheral perfusion. RRR RESP:  Normal effort. CTA ABD:  No distention.  MSK:  Right foot with some erythema and tenderness to the right great toe at the first MTP.  No significant edema is noted.   ED Results / Procedures / Treatments   Labs (all labs ordered are listed, but only abnormal results are displayed) Labs Reviewed - No data to display   EKG    RADIOLOGY  No results found.   PROCEDURES:  Critical Care performed: No  Procedures   MEDICATIONS ORDERED IN ED: Medications - No data to  display   IMPRESSION / MDM / ASSESSMENT AND PLAN / ED COURSE  I reviewed the triage vital signs and the nursing notes.                              Differential diagnosis includes, but is not limited to, gout, DJD, foot contusion, foot sprain, cellulitis  Patient's presentation is most consistent with acute, uncomplicated illness.  Patient's diagnosis is consistent with acute gout involving the right great toe.  With reassuring exam and workup at this time.  Patient will be discharged home with prescriptions for hydrocodone. Patient is to follow up with his primary provider as discussed, as needed or otherwise directed. Patient is given ED precautions to return to the ED for any worsening or new symptoms.     FINAL CLINICAL IMPRESSION(S) / ED DIAGNOSES   Final diagnoses:  Acute gout involving toe of right foot, unspecified cause     Rx / DC Orders  ED Discharge Orders          Ordered    HYDROcodone-acetaminophen (NORCO/VICODIN) 5-325 MG tablet  2 times daily PRN        04/11/23 2107             Note:  This document was prepared using Dragon voice recognition software and may include unintentional dictation errors.    Lissa Hoard, PA-C 04/15/23 1839    Trinna Post, MD 04/15/23 [phone]

## 2023-04-11 NOTE — ED Provider Triage Note (Signed)
Emergency Medicine Provider Triage Evaluation Note  [name] , a 78 y.o. male  was evaluated in triage.  Pt complains of right great toe pain today. Hx of gout. No injury or trauma.   Review of Systems  Positive:  Negative:   Physical Exam  BP (!) 101/59   Pulse 66   Temp 98.6 F (37 C) (Oral)   Resp 18   Ht 5\' 7"  (1.702 m)   Wt 72.6 kg   SpO2 97%   BMI 25.06 kg/m  Gen:   Awake, no distress   Resp:  Normal effort  MSK:   Moves extremities without difficulty  Other:  Right MCP joint is tender to palpation. FROM of all digits and good palpable pedal pulse. No redness or warmth.  Medical Decision Making  Medically screening exam initiated at 3:12 PM.  Appropriate orders placed.  [name] was informed that the remainder of the evaluation will be completed by another provider, this initial triage assessment does not replace that evaluation, and the importance of remaining in the ED until their evaluation is complete.    Romeo Apple, Addysin Porco A, PA-C 04/11/23 1514

## 2023-08-01 ENCOUNTER — Emergency Department

## 2023-08-01 ENCOUNTER — Emergency Department
Admission: EM | Admit: 2023-08-01 | Discharge: 2023-08-01 | Disposition: A | Discharge status: Home / Self Care | Attending: Emergency Medicine | Admitting: Emergency Medicine

## 2023-08-01 DIAGNOSIS — R600 Localized edema: Secondary | ICD-10-CM | POA: Insufficient documentation

## 2023-08-01 DIAGNOSIS — G20A1 Parkinson's disease without dyskinesia, without mention of fluctuations: Secondary | ICD-10-CM | POA: Insufficient documentation

## 2023-08-01 DIAGNOSIS — M25462 Effusion, left knee: Secondary | ICD-10-CM | POA: Diagnosis not present

## 2023-08-01 DIAGNOSIS — M109 Gout, unspecified: Secondary | ICD-10-CM | POA: Diagnosis not present

## 2023-08-01 DIAGNOSIS — M25562 Pain in left knee: Secondary | ICD-10-CM | POA: Insufficient documentation

## 2023-08-01 DIAGNOSIS — N183 Chronic kidney disease, stage 3 unspecified: Secondary | ICD-10-CM | POA: Insufficient documentation

## 2023-08-01 DIAGNOSIS — I129 Hypertensive chronic kidney disease with stage 1 through stage 4 chronic kidney disease, or unspecified chronic kidney disease: Secondary | ICD-10-CM | POA: Insufficient documentation

## 2023-08-01 LAB — COMPREHENSIVE METABOLIC PANEL WITH GFR
ALT: <5 U/L (ref 0–44)
AST: 14 U/L — ABNORMAL LOW (ref 15–41)
Albumin: 3.3 g/dL — ABNORMAL LOW (ref 3.5–5.0)
Alkaline Phosphatase: 52 U/L (ref 38–126)
Anion gap: 5 (ref 5–15)
BUN: 19 mg/dL (ref 8–23)
CO2: 25 mmol/L (ref 22–32)
Calcium: 8.9 mg/dL (ref 8.9–10.3)
Chloride: 111 mmol/L (ref 98–111)
Creatinine, Ser: 1.59 mg/dL — ABNORMAL HIGH (ref 0.61–1.24)
GFR, Estimated: 44 mL/min — ABNORMAL LOW (ref >60)
Glucose, Bld: 139 mg/dL — ABNORMAL HIGH (ref 70–99)
Potassium: 4.3 mmol/L (ref 3.5–5.1)
Sodium: 141 mmol/L (ref 135–145)
Total Bilirubin: 0.5 mg/dL (ref 0.0–1.2)
Total Protein: 6.1 g/dL — ABNORMAL LOW (ref 6.5–8.1)

## 2023-08-01 LAB — CBC WITH DIFFERENTIAL/PLATELET
Abs Immature Granulocytes: 0.05 10*3/uL (ref 0.00–0.07)
Basophils Absolute: 0 10*3/uL (ref 0.0–0.1)
Basophils Relative: 0 %
Eosinophils Absolute: 0.2 10*3/uL (ref 0.0–0.5)
Eosinophils Relative: 2 %
HCT: 30.7 % — ABNORMAL LOW (ref 39.0–52.0)
Hemoglobin: 9.8 g/dL — ABNORMAL LOW (ref 13.0–17.0)
Immature Granulocytes: 1 %
Lymphocytes Relative: 19 %
Lymphs Abs: 1.7 10*3/uL (ref 0.7–4.0)
MCH: 31.2 pg (ref 26.0–34.0)
MCHC: 31.9 g/dL (ref 30.0–36.0)
MCV: 97.8 fL (ref 80.0–100.0)
Monocytes Absolute: 0.7 10*3/uL (ref 0.1–1.0)
Monocytes Relative: 8 %
Neutro Abs: 6.5 10*3/uL (ref 1.7–7.7)
Neutrophils Relative %: 70 %
Platelets: 196 10*3/uL (ref 150–400)
RBC: 3.14 MIL/uL — ABNORMAL LOW (ref 4.22–5.81)
RDW: 13.3 % (ref 11.5–15.5)
WBC: 9.2 10*3/uL (ref 4.0–10.5)
nRBC: 0 % (ref 0.0–0.2)

## 2023-08-01 LAB — BRAIN NATRIURETIC PEPTIDE: B Natriuretic Peptide: 129.6 pg/mL — ABNORMAL HIGH (ref 0.0–100.0)

## 2023-08-01 LAB — URIC ACID: Uric Acid, Serum: 7.1 mg/dL (ref 3.7–8.6)

## 2023-08-01 MED ORDER — TRAMADOL HCL 50 MG PO TABS
50.0000 mg | ORAL_TABLET | Freq: Once | ORAL | Status: AC
  Administered 2023-08-01: 50 mg via ORAL
  Filled 2023-08-01: qty 1

## 2023-08-01 MED ORDER — PREDNISONE 10 MG (21) PO TBPK: ORAL_TABLET | ORAL | 0 refills | Status: AC

## 2023-08-01 MED ORDER — PREDNISONE 20 MG PO TABS
50.0000 mg | ORAL_TABLET | Freq: Once | ORAL | Status: AC
  Administered 2023-08-01: 50 mg via ORAL
  Filled 2023-08-01: qty 3

## 2023-08-01 NOTE — Discharge Instructions (Addendum)
 You are seen in the emergency department today with acute left knee pain.  We think this is due to gout.  Please pick up and take the steroid as prescribed.  Please return to the emergency department or get in contact with your PCP if you start developing fever, are unable to bend your knee, increasing redness, swelling, or any other new or concerning symptoms.

## 2023-08-01 NOTE — ED Provider Notes (Signed)
 [facility] Provider Note    Event Date/Time   First MD Initiated Contact with Patient 08/01/23 1529     (approximate)   History   Knee Pain   HPI PACER DORN Sr. is a 78 y.o. male ending to the emergency department with left lateral knee pain x 3 days.  Patient reports increased swelling to the affected area as well.  Denies injury, fall.  He describes the pain as feeling "similar to his gout flare-ups in the past" .  Pain was 8 out of 10 in triage.  He has been elevating the affected area.  Patient states he was taking colchicine  but his regular doctor took him off the medication due to his CKD.  He also reports increased swelling to his bilateral lower extremities x 1 week.  This edema has been present for a while, since 2024 when going back to look at previous notes, but has recently increased.  He does not have any pain to these areas.  Denies shortness of breath, chest pain, dyspnea, calf pain, fever, erythema.   Past medical history includes CKD stage 3, hypertension, gout, parkinson's disease.  Physical Exam   Triage Vital Signs: ED Triage Vitals [08/01/23 1526]  Encounter Vitals Group     BP 136/75     Systolic BP Percentile      Diastolic BP Percentile      Pulse Rate 65     Resp 16     Temp 98.6 F (37 C)     Temp Source Oral     SpO2 96 %     Weight 160 lb (72.6 kg)     Height 5\' 7"  (1.702 m)     Head Circumference      Peak Flow      Pain Score 8     Pain Loc      Pain Education      Exclude from Growth Chart     Most recent vital signs: Vitals:   08/01/23 1526  BP: 136/75  Pulse: 65  Resp: 16  Temp: 98.6 F (37 C)  SpO2: 96%    General: Well-appearing, in no acute distress. Appears stated age. Head: Normocephalic, atraumatic. Neck: Supple, no lymphadenopathy, no JVD, no nuchal rigidity. CV: Regular rate, 65 bpm. Dorsalis pedis pulses 2+ and symmetric. 3+ pitting edema. Respiratory: No respiratory distress. Normal  respiratory effort. GI: Soft, non-distended. MSK: Reduced knee flexion in left knee. Mild swelling and tender to palpation on lateral aspect of left knee.  Strength 5/5 in bilateral lower extremities.  Skin:Warm, dry, intact. No rashes, lesions, or ecchymosis. No cyanosis or pallor.  Ambulatory from triage.  ED Results / Procedures / Treatments   Labs (all labs ordered are listed, but only abnormal results are displayed) Labs Reviewed  CBC WITH DIFFERENTIAL/PLATELET - Abnormal; Notable for the following components:      Result Value   RBC 3.14 (*)    Hemoglobin 9.8 (*)    HCT 30.7 (*)    All other components within normal limits  COMPREHENSIVE METABOLIC PANEL WITH GFR - Abnormal; Notable for the following components:   Glucose, Bld 139 (*)    Creatinine, Ser 1.59 (*)    Total Protein 6.1 (*)    Albumin 3.3 (*)    AST 14 (*)    GFR, Estimated 44 (*)    All other components within normal limits  BRAIN NATRIURETIC PEPTIDE - Abnormal; Notable for the following components:   B  Natriuretic Peptide 129.6 (*)    All other components within normal limits  URIC ACID     EKG     RADIOLOGY  X-ray of left knee ordered.  PROCEDURES:  Critical Care performed: No  Procedures   MEDICATIONS ORDERED IN ED: Medications  predniSONE  (DELTASONE ) tablet 50 mg (50 mg Oral Given 08/01/23 1717)  traMADol (ULTRAM) tablet 50 mg (50 mg Oral Given 08/01/23 1717)     IMPRESSION / MDM / ASSESSMENT AND PLAN / ED COURSE  I reviewed the triage vital signs and the nursing notes.                              Differential diagnosis includes, but is not limited to, pseudogout, osteoarthritis, gout, septic arthritis,   Patient's presentation is most consistent with acute complicated illness / injury requiring diagnostic workup.  Patient is a 78 year old male that presented today for acute left lateral knee pain that started 3 days ago.  Physical exam shows swelling and tenderness to palpation of  the lateral aspect of the knee as well as reduced flexion.  Patient was ambulatory from triage and did drive himself here.  CBC, CMP, BNP, uric acid ordered as well as x-ray of left knee.  I independently reviewed the x-ray which showed evidence of chondrocalcinosis, narrowing of the joint space, and some osteophytes.  I reviewed and agree with the radiologist's report that there are moderate degenerative changes consistent with osteoarthritis, small joint effusion, and no acute fracture or dislocation.  Uric acid normal at 7.1.  Creatinine 1.59, GFR 44, both consistent with his known CKD.  CBC shows white blood cell count 9.2. Reviewed labs drawn on May 8 which showed hemoglobin A1c of 6.2.  I had Dr. Martina Sledge, my supervising physician, examine him as well.  With no fever, white blood cell count within normal range, history of gout, and still able to perform some range of motion, as well as osteoarthritic and chondrocalcinosis findings on x-ray, we discussed that this is less concerning for septic arthritis and more likely a flareup of his gout. With his CKD, I do not want to provide NSAIDs or Colchicine . Patient does have a corn allergy listed which brought up a flag for prednisone , but he has taken this medication in the past with no reactions; also discussed with Dr. Martina Sledge and he stated this medication is okay for administration. Patient was given prednisone  tablet 50 mg and tramadol 50 mg here in the emergency department.  Prednisone  taper sent to pharmacy of choice.   Emergency department return precautions were discussed with the patient.  Patient is in agreement to the treatment plan.  Patient is stable for discharge.      FINAL CLINICAL IMPRESSION(S) / ED DIAGNOSES   Final diagnoses:  Acute pain of left knee     Rx / DC Orders   ED Discharge Orders          Ordered    predniSONE  (STERAPRED UNI-PAK 21 TAB) 10 MG (21) TBPK tablet        08/01/23 1719             Note:  This document  was prepared using Dragon voice recognition software and may include unintentional dictation errors.    Thomasenia Flesher, PA-C 08/01/23 1753    Bryson Carbine, MD 08/01/23 [phone]

## 2023-08-01 NOTE — ED Triage Notes (Signed)
 Pt states for the last three days he's had increased pain and swelling to his L knee. No known of injury per pt. Pt states pain feels similar to gout flare ups in the past. Not currently on medication for gout.

## 2023-08-15 ENCOUNTER — Emergency Department
Admission: EM | Admit: 2023-08-15 | Discharge: 2023-08-15 | Disposition: A | Discharge status: Home / Self Care | Attending: Emergency Medicine | Admitting: Emergency Medicine

## 2023-08-15 ENCOUNTER — Other Ambulatory Visit: Payer: Self-pay

## 2023-08-15 DIAGNOSIS — E1122 Type 2 diabetes mellitus with diabetic chronic kidney disease: Secondary | ICD-10-CM | POA: Insufficient documentation

## 2023-08-15 DIAGNOSIS — N189 Chronic kidney disease, unspecified: Secondary | ICD-10-CM | POA: Diagnosis not present

## 2023-08-15 DIAGNOSIS — I2510 Atherosclerotic heart disease of native coronary artery without angina pectoris: Secondary | ICD-10-CM | POA: Diagnosis not present

## 2023-08-15 DIAGNOSIS — M109 Gout, unspecified: Secondary | ICD-10-CM | POA: Insufficient documentation

## 2023-08-15 DIAGNOSIS — D72829 Elevated white blood cell count, unspecified: Secondary | ICD-10-CM | POA: Diagnosis not present

## 2023-08-15 DIAGNOSIS — I129 Hypertensive chronic kidney disease with stage 1 through stage 4 chronic kidney disease, or unspecified chronic kidney disease: Secondary | ICD-10-CM | POA: Insufficient documentation

## 2023-08-15 DIAGNOSIS — R2232 Localized swelling, mass and lump, left upper limb: Secondary | ICD-10-CM | POA: Diagnosis present

## 2023-08-15 LAB — CBC WITH DIFFERENTIAL/PLATELET
Abs Immature Granulocytes: 0.09 10*3/uL — ABNORMAL HIGH (ref 0.00–0.07)
Basophils Absolute: 0.1 10*3/uL (ref 0.0–0.1)
Basophils Relative: 0 %
Eosinophils Absolute: 0.2 10*3/uL (ref 0.0–0.5)
Eosinophils Relative: 2 %
HCT: 33.6 % — ABNORMAL LOW (ref 39.0–52.0)
Hemoglobin: 10.5 g/dL — ABNORMAL LOW (ref 13.0–17.0)
Immature Granulocytes: 1 %
Lymphocytes Relative: 16 %
Lymphs Abs: 1.8 10*3/uL (ref 0.7–4.0)
MCH: 30.5 pg (ref 26.0–34.0)
MCHC: 31.3 g/dL (ref 30.0–36.0)
MCV: 97.7 fL (ref 80.0–100.0)
Monocytes Absolute: 1 10*3/uL (ref 0.1–1.0)
Monocytes Relative: 9 %
Neutro Abs: 8.1 10*3/uL — ABNORMAL HIGH (ref 1.7–7.7)
Neutrophils Relative %: 72 %
Platelets: 171 10*3/uL (ref 150–400)
RBC: 3.44 MIL/uL — ABNORMAL LOW (ref 4.22–5.81)
RDW: 13.4 % (ref 11.5–15.5)
WBC: 11.2 10*3/uL — ABNORMAL HIGH (ref 4.0–10.5)
nRBC: 0 % (ref 0.0–0.2)

## 2023-08-15 LAB — BASIC METABOLIC PANEL WITH GFR
Anion gap: 5 (ref 5–15)
BUN: 20 mg/dL (ref 8–23)
CO2: 29 mmol/L (ref 22–32)
Calcium: 8.8 mg/dL — ABNORMAL LOW (ref 8.9–10.3)
Chloride: 107 mmol/L (ref 98–111)
Creatinine, Ser: 1.48 mg/dL — ABNORMAL HIGH (ref 0.61–1.24)
GFR, Estimated: 48 mL/min — ABNORMAL LOW (ref >60)
Glucose, Bld: 104 mg/dL — ABNORMAL HIGH (ref 70–99)
Potassium: 4.2 mmol/L (ref 3.5–5.1)
Sodium: 141 mmol/L (ref 135–145)

## 2023-08-15 MED ORDER — OXYCODONE-ACETAMINOPHEN 5-325 MG PO TABS: 1.0000 | ORAL_TABLET | ORAL | 0 refills | Status: AC | PRN

## 2023-08-15 MED ORDER — PREDNISONE 10 MG PO TABS: ORAL_TABLET | ORAL | 0 refills | Status: AC

## 2023-08-15 MED ORDER — OXYCODONE-ACETAMINOPHEN 5-325 MG PO TABS
1.0000 | ORAL_TABLET | Freq: Once | ORAL | Status: AC
  Administered 2023-08-15: 1 via ORAL
  Filled 2023-08-15: qty 1

## 2023-08-15 MED ORDER — PREDNISONE 20 MG PO TABS
60.0000 mg | ORAL_TABLET | Freq: Once | ORAL | Status: AC
  Administered 2023-08-15: 60 mg via ORAL
  Filled 2023-08-15: qty 3

## 2023-08-15 NOTE — ED Triage Notes (Addendum)
 Pt comes with left arm and bilateral ankle swelling. Pt states this started about 4 days ago. Pt states he has been out of his gout medicine. Pt denies any cp or sob. Pt states pain in left wrist.   Pt has noticeable swelling to left arm down to hand. Pt has swelling noted to bilateral ankles.

## 2023-08-15 NOTE — ED Provider Notes (Signed)
 [facility] Provider Note    Event Date/Time   First MD Initiated Contact with Patient 08/15/23 0825     (approximate)   History   Chief Complaint Arm Swelling and Joint Swelling   HPI  Paul ZENTNER Sr. is a 78 y.o. male with past medical history of hypertension, diabetes, CAD, CKD, gout, and Parkinson's disease who presents to the ED complaining of joint swelling.  Patient reports that he has had 3 to 4 days of increasing swelling around his left wrist and hand.  He denies any trauma to his wrist, states that pain and swelling is similar to prior gout flares.  He states that he does not take any medication for gout currently as his kidney doctor took him off of colchicine .  He reports typically taking a course of steroids for his gout flares, does not follow with a rheumatologist.  He has chronic swelling around both ankles that he states is no worse than usual today, does not have any pain in either ankle.     Physical Exam   Triage Vital Signs: ED Triage Vitals  Encounter Vitals Group     BP 08/15/23 0820 (!) 142/70     Girls Systolic BP Percentile --      Girls Diastolic BP Percentile --      Boys Systolic BP Percentile --      Boys Diastolic BP Percentile --      Pulse Rate 08/15/23 0820 (!) 57     Resp 08/15/23 0820 17     Temp 08/15/23 0820 98.3 F (36.8 C)     Temp src --      SpO2 08/15/23 0820 100 %     Weight 08/15/23 0819 160 lb (72.6 kg)     Height 08/15/23 0819 5' 7 (1.702 m)     Head Circumference --      Peak Flow --      Pain Score 08/15/23 0819 10     Pain Loc --      Pain Education --      Exclude from Growth Chart --     Most recent vital signs: Vitals:   08/15/23 0820  BP: (!) 142/70  Pulse: (!) 57  Resp: 17  Temp: 98.3 F (36.8 C)  SpO2: 100%    Constitutional: Alert and oriented. Eyes: Conjunctivae are normal. Head: Atraumatic. Nose: No congestion/rhinnorhea. Mouth/Throat: Mucous membranes are moist.   Cardiovascular: Normal rate, regular rhythm. Grossly normal heart sounds.  2+ radial and DP pulses bilaterally. Respiratory: Normal respiratory effort.  No retractions. Lungs CTAB. Gastrointestinal: Soft and nontender. No distention. Musculoskeletal: No lower extremity tenderness, 1+ pitting edema to ankles bilaterally.  Edema and mild warmth noted to left wrist and hand with associated tenderness, range of motion intact to left wrist with significant discomfort. Neurologic:  Normal speech and language. No gross focal neurologic deficits are appreciated.    ED Results / Procedures / Treatments   Labs (all labs ordered are listed, but only abnormal results are displayed) Labs Reviewed  CBC WITH DIFFERENTIAL/PLATELET - Abnormal; Notable for the following components:      Result Value   WBC 11.2 (*)    RBC 3.44 (*)    Hemoglobin 10.5 (*)    HCT 33.6 (*)    Neutro Abs 8.1 (*)    Abs Immature Granulocytes 0.09 (*)    All other components within normal limits  BASIC METABOLIC PANEL WITH GFR - Abnormal; Notable for the  following components:   Glucose, Bld 104 (*)    Creatinine, Ser 1.48 (*)    Calcium 8.8 (*)    GFR, Estimated 48 (*)    All other components within normal limits     PROCEDURES:  Critical Care performed: No  Procedures   MEDICATIONS ORDERED IN ED: Medications  predniSONE  (DELTASONE ) tablet 60 mg (60 mg Oral Given 08/15/23 0844)  oxyCODONE-acetaminophen  (PERCOCET/ROXICET) 5-325 MG per tablet 1 tablet (1 tablet Oral Given 08/15/23 0844)     IMPRESSION / MDM / ASSESSMENT AND PLAN / ED COURSE  I reviewed the triage vital signs and the nursing notes.                              78 y.o. male with past medical history of hypertension, diabetes, CAD, CKD, gout, and Parkinson's disease who presents to the ED complaining of pain and swelling around his left hand and wrist for the past 3 to 4 days.  Patient's presentation is most consistent with acute presentation  with potential threat to life or bodily function.  Differential diagnosis includes, but is not limited to, septic arthritis, gout flare, DVT, fracture, anemia, electrolyte abnormality, AKI.  Patient nontoxic-appearing and in no acute distress, vital signs are unremarkable.  Patient has pain and swelling with tenderness around his left wrist which seems consistent with a gout flare.  Low suspicion for septic arthritis at this time and no recent trauma to suggest bony injury.  We will treat with prednisone  and Percocet given prior issues with kidney dysfunction when taking colchicine .  Lower extremity swelling is chronic and unchanged today, no findings concerning for DVT, infection, or ischemia.  Labs without significant anemia or leukocytosis, renal function comparable to previous with no acute electrolyte abnormality.  His pain is improved on reassessment and he is appropriate for discharge home with outpatient follow-up, referral to rheumatology was provided.  We will prescribe steroid taper as well as short course of pain medication.  He was counseled to return to the ED for new or worsening symptoms, patient agrees with plan.      FINAL CLINICAL IMPRESSION(S) / ED DIAGNOSES   Final diagnoses:  Acute gout of left wrist, unspecified cause     Rx / DC Orders   ED Discharge Orders          Ordered    oxyCODONE-acetaminophen  (PERCOCET) 5-325 MG tablet  Every 4 hours PRN        08/15/23 0915    predniSONE  (DELTASONE ) 10 MG tablet  Daily        08/15/23 0915             Note:  This document was prepared using Dragon voice recognition software and may include unintentional dictation errors.   Willo Dunnings, MD 08/15/23 [phone]

## 2024-01-02 ENCOUNTER — Emergency Department
Admission: EM | Admit: 2024-01-02 | Discharge: 2024-01-02 | Disposition: A | Discharge status: Home / Self Care | Attending: Emergency Medicine | Admitting: Emergency Medicine

## 2024-01-02 ENCOUNTER — Encounter: Payer: Self-pay | Admitting: Emergency Medicine

## 2024-01-02 DIAGNOSIS — M7989 Other specified soft tissue disorders: Secondary | ICD-10-CM | POA: Diagnosis present

## 2024-01-02 DIAGNOSIS — R600 Localized edema: Secondary | ICD-10-CM | POA: Insufficient documentation

## 2024-01-02 DIAGNOSIS — I10 Essential (primary) hypertension: Secondary | ICD-10-CM | POA: Diagnosis not present

## 2024-01-02 DIAGNOSIS — I2510 Atherosclerotic heart disease of native coronary artery without angina pectoris: Secondary | ICD-10-CM | POA: Insufficient documentation

## 2024-01-02 DIAGNOSIS — E119 Type 2 diabetes mellitus without complications: Secondary | ICD-10-CM | POA: Insufficient documentation

## 2024-01-02 DIAGNOSIS — G20A1 Parkinson's disease without dyskinesia, without mention of fluctuations: Secondary | ICD-10-CM | POA: Insufficient documentation

## 2024-01-02 LAB — CBC WITH DIFFERENTIAL/PLATELET
Abs Immature Granulocytes: 0.06 K/uL (ref 0.00–0.07)
Basophils Absolute: 0 K/uL (ref 0.0–0.1)
Basophils Relative: 1 %
Eosinophils Absolute: 0.2 K/uL (ref 0.0–0.5)
Eosinophils Relative: 2 %
HCT: 33.1 % — ABNORMAL LOW (ref 39.0–52.0)
Hemoglobin: 10.5 g/dL — ABNORMAL LOW (ref 13.0–17.0)
Immature Granulocytes: 1 %
Lymphocytes Relative: 25 %
Lymphs Abs: 2.2 K/uL (ref 0.7–4.0)
MCH: 31.2 pg (ref 26.0–34.0)
MCHC: 31.7 g/dL (ref 30.0–36.0)
MCV: 98.2 fL (ref 80.0–100.0)
Monocytes Absolute: 0.8 K/uL (ref 0.1–1.0)
Monocytes Relative: 9 %
Neutro Abs: 5.6 K/uL (ref 1.7–7.7)
Neutrophils Relative %: 62 %
Platelets: 245 K/uL (ref 150–400)
RBC: 3.37 MIL/uL — ABNORMAL LOW (ref 4.22–5.81)
RDW: 13.7 % (ref 11.5–15.5)
WBC: 8.8 K/uL (ref 4.0–10.5)
nRBC: 0 % (ref 0.0–0.2)

## 2024-01-02 LAB — COMPREHENSIVE METABOLIC PANEL WITH GFR
ALT: 5 U/L (ref 0–44)
AST: 13 U/L — ABNORMAL LOW (ref 15–41)
Albumin: 3.2 g/dL — ABNORMAL LOW (ref 3.5–5.0)
Alkaline Phosphatase: 55 U/L (ref 38–126)
Anion gap: 11 (ref 5–15)
BUN: 18 mg/dL (ref 8–23)
CO2: 26 mmol/L (ref 22–32)
Calcium: 9.1 mg/dL (ref 8.9–10.3)
Chloride: 104 mmol/L (ref 98–111)
Creatinine, Ser: 1.59 mg/dL — ABNORMAL HIGH (ref 0.61–1.24)
GFR, Estimated: 44 mL/min — ABNORMAL LOW (ref >60)
Glucose, Bld: 110 mg/dL — ABNORMAL HIGH (ref 70–99)
Potassium: 3.8 mmol/L (ref 3.5–5.1)
Sodium: 141 mmol/L (ref 135–145)
Total Bilirubin: 0.5 mg/dL (ref 0.0–1.2)
Total Protein: 7.2 g/dL (ref 6.5–8.1)

## 2024-01-02 LAB — URIC ACID: Uric Acid, Serum: 4.6 mg/dL (ref 3.7–8.6)

## 2024-01-02 NOTE — Discharge Instructions (Signed)
 Your labs and examination are reassuring today.  Your leg swelling may be a side effect from amlodipine.  Please follow-up with your doctor for recheck of your blood pressure and adjustment of your blood pressure medicine if needed.

## 2024-01-02 NOTE — ED Provider Notes (Signed)
 [facility] Provider Note    Event Date/Time   First MD Initiated Contact with Patient 01/02/24 1958     (approximate)   History   Chief Complaint: Leg Swelling   HPI  Paul WINTERTON Sr. is a 78 y.o. male with a history of diabetes, GERD, hypertension, gout who comes to the ED complaining of bilateral leg swelling for the past 2 weeks.  When symptoms started he thought it was related to gout so he took colchicine  but has not had resolution.  No chest pain or shortness of breath, no fever or chills or redness.   Does take amlodipine 10 mg daily.     Past Medical History:  Diagnosis Date   Bilateral lower extremity edema    Coronary artery disease    Diabetes mellitus without complication (HCC)    Enlarged prostate    GERD (gastroesophageal reflux disease)    Gout    Hyperlipidemia    Hypertension    Neuromuscular disorder (HCC)    Parkinson disease (HCC)    Renal insufficiency    Right inguinal hernia    Sleep apnea    Tachycardia     Current Outpatient Rx   Order #: [id number] Class: Historical Med   Order #: [id number] Class: Historical Med   Order #: [id number] Class: Historical Med   Order #: [id number] Class: Historical Med   Order #: [id number] Class: Historical Med   Order #: [id number] Class: Historical Med   Order #: [id number] Class: Historical Med   Order #: [id number] Class: Historical Med   Order #: [id number] Class: Historical Med   Order #: [id number] Class: Normal   Order #: [id number] Class: Historical Med    Past Surgical History:  Procedure Laterality Date   INSERTION OF MESH  10/21/2022   Procedure: INSERTION OF MESH;  Surgeon: Lane Shope, MD;  Location: [facility] ORS;  Service: General;;   PROSTATE SURGERY     TONSILLECTOMY      Physical Exam   Triage Vital Signs: ED Triage Vitals  Encounter Vitals Group     BP 01/02/24 1602 139/69     Girls Systolic BP Percentile --      Girls Diastolic BP Percentile --      Boys  Systolic BP Percentile --      Boys Diastolic BP Percentile --      Pulse Rate 01/02/24 1601 69     Resp 01/02/24 1601 18     Temp 01/02/24 1601 98.3 F (36.8 C)     Temp Source 01/02/24 1601 Oral     SpO2 01/02/24 1601 98 %     Weight --      Height --      Head Circumference --      Peak Flow --      Pain Score 01/02/24 1601 2     Pain Loc --      Pain Education --      Exclude from Growth Chart --     Most recent vital signs: Vitals:   01/02/24 2038 01/02/24 2100  BP:  (!) 178/70  Pulse:  (!) 50  Resp:  18  Temp: 98.3 F (36.8 C)   SpO2:  100%    General: Awake, no distress.  CV:  Good peripheral perfusion.  Regular rate rhythm, normal distal pulses Resp:  Normal effort.  Abd:  No distention.  Other:  Mild lymphedema bilateral lower extremities, symmetric.  No calf tenderness.  No inflammatory soft tissue changes.  Full range of motion all extremities  ED Results / Procedures / Treatments   Labs (all labs ordered are listed, but only abnormal results are displayed) Labs Reviewed  COMPREHENSIVE METABOLIC PANEL WITH GFR - Abnormal; Notable for the following components:      Result Value   Glucose, Bld 110 (*)    Creatinine, Ser 1.59 (*)    Albumin 3.2 (*)    AST 13 (*)    GFR, Estimated 44 (*)    All other components within normal limits  CBC WITH DIFFERENTIAL/PLATELET - Abnormal; Notable for the following components:   RBC 3.37 (*)    Hemoglobin 10.5 (*)    HCT 33.1 (*)    All other components within normal limits  URIC ACID     EKG    RADIOLOGY    PROCEDURES:  Procedures   MEDICATIONS ORDERED IN ED: Medications - No data to display   IMPRESSION / MDM / ASSESSMENT AND PLAN / ED COURSE  I reviewed the triage vital signs and the nursing notes.  DDx: Electrolyte derangement, anemia, lymphedema, amlodipine side effect, hypoalbuminemia.  Doubt DVT, infection, hypothyroidism  Patient's presentation is most consistent with acute presentation  with potential threat to life or bodily function.  Patient presents with symmetric peripheral edema bilateral lower extremities.  Overall benign exam, reassuring vitals.  Suspect side effect to amlodipine.  Recommend follow-up with PCP for reassessment.       FINAL CLINICAL IMPRESSION(S) / ED DIAGNOSES   Final diagnoses:  Peripheral edema     Rx / DC Orders   ED Discharge Orders     None        Note:  This document was prepared using Dragon voice recognition software and may include unintentional dictation errors.   Viviann Pastor, MD 01/02/24 2340

## 2024-01-02 NOTE — ED Triage Notes (Signed)
 Patient arrives ambulatory with cane c/o left leg swelling and pain x 2 weeks. Warm to touch.

## 2024-01-02 NOTE — ED Notes (Addendum)
Pt given a warm blanket
